# Patient Record
Sex: Male | Born: 1980 | Race: White | Hispanic: No | Marital: Single | Smoking: Former smoker
Health system: Southern US, Community
[De-identification: ages and names within clinical notes are randomized; demographics above are authoritative.]

## PROBLEM LIST (undated history)

## (undated) DIAGNOSIS — F4310 Post-traumatic stress disorder, unspecified: Secondary | ICD-10-CM

## (undated) DIAGNOSIS — K760 Fatty (change of) liver, not elsewhere classified: Secondary | ICD-10-CM

## (undated) DIAGNOSIS — F1910 Other psychoactive substance abuse, uncomplicated: Secondary | ICD-10-CM

## (undated) DIAGNOSIS — F319 Bipolar disorder, unspecified: Secondary | ICD-10-CM

## (undated) DIAGNOSIS — F419 Anxiety disorder, unspecified: Secondary | ICD-10-CM

## (undated) HISTORY — PX: NO PAST SURGERIES: SHX2092

## (undated) HISTORY — PX: FRACTURE SURGERY: SHX138

---

## 2007-04-11 ENCOUNTER — Emergency Department: Payer: Self-pay | Admitting: Emergency Medicine

## 2008-03-26 ENCOUNTER — Emergency Department: Payer: Self-pay | Admitting: Emergency Medicine

## 2008-08-09 ENCOUNTER — Emergency Department (HOSPITAL_COMMUNITY): Admission: EM | Admit: 2008-08-09 | Discharge: 2008-08-09 | Payer: Self-pay | Admitting: Emergency Medicine

## 2008-08-31 ENCOUNTER — Emergency Department: Payer: Self-pay | Admitting: Emergency Medicine

## 2008-09-18 ENCOUNTER — Emergency Department: Payer: Self-pay | Admitting: Emergency Medicine

## 2009-01-24 ENCOUNTER — Emergency Department: Payer: Self-pay | Admitting: Emergency Medicine

## 2009-04-18 ENCOUNTER — Emergency Department: Payer: Self-pay | Admitting: Emergency Medicine

## 2009-04-24 ENCOUNTER — Emergency Department: Payer: Self-pay | Admitting: Emergency Medicine

## 2009-09-24 ENCOUNTER — Inpatient Hospital Stay: Payer: Self-pay | Admitting: Psychiatry

## 2009-10-08 ENCOUNTER — Emergency Department: Payer: Self-pay | Admitting: Emergency Medicine

## 2009-10-26 ENCOUNTER — Emergency Department: Payer: Self-pay | Admitting: Emergency Medicine

## 2010-06-25 ENCOUNTER — Emergency Department: Payer: Self-pay | Admitting: Emergency Medicine

## 2010-07-15 LAB — POCT I-STAT, CHEM 8
BUN: 7 mg/dL (ref 6–23)
Calcium, Ion: 1.09 mmol/L — ABNORMAL LOW (ref 1.12–1.32)
Chloride: 105 mEq/L (ref 96–112)

## 2010-07-15 LAB — RAPID URINE DRUG SCREEN, HOSP PERFORMED
Barbiturates: NOT DETECTED
Opiates: NOT DETECTED
Tetrahydrocannabinol: NOT DETECTED

## 2010-07-15 LAB — ETHANOL: Alcohol, Ethyl (B): 229 mg/dL — ABNORMAL HIGH (ref 0–10)

## 2011-08-07 ENCOUNTER — Emergency Department: Payer: Self-pay | Admitting: Emergency Medicine

## 2011-08-08 ENCOUNTER — Emergency Department: Payer: Self-pay | Admitting: Unknown Physician Specialty

## 2011-08-13 ENCOUNTER — Emergency Department: Payer: Self-pay | Admitting: Emergency Medicine

## 2011-08-14 ENCOUNTER — Emergency Department (HOSPITAL_COMMUNITY)
Admission: EM | Admit: 2011-08-14 | Discharge: 2011-08-14 | Disposition: A | Discharge status: Home / Self Care | Payer: Self-pay | Attending: Emergency Medicine | Admitting: Emergency Medicine

## 2011-08-14 ENCOUNTER — Encounter (HOSPITAL_COMMUNITY): Payer: Self-pay

## 2011-08-14 DIAGNOSIS — Z09 Encounter for follow-up examination after completed treatment for conditions other than malignant neoplasm: Secondary | ICD-10-CM | POA: Insufficient documentation

## 2011-08-14 DIAGNOSIS — F319 Bipolar disorder, unspecified: Secondary | ICD-10-CM | POA: Insufficient documentation

## 2011-08-14 DIAGNOSIS — M25469 Effusion, unspecified knee: Secondary | ICD-10-CM | POA: Insufficient documentation

## 2011-08-14 DIAGNOSIS — IMO0002 Reserved for concepts with insufficient information to code with codable children: Secondary | ICD-10-CM

## 2011-08-14 DIAGNOSIS — M25569 Pain in unspecified knee: Secondary | ICD-10-CM | POA: Insufficient documentation

## 2011-08-14 HISTORY — DX: Bipolar disorder, unspecified: F31.9

## 2011-08-14 MED ORDER — OXYCODONE-ACETAMINOPHEN 5-325 MG PO TABS
1.0000 | ORAL_TABLET | Freq: Once | ORAL | Status: AC
  Administered 2011-08-14: 1 via ORAL
  Filled 2011-08-14: qty 1

## 2011-08-14 MED ORDER — OXYCODONE-ACETAMINOPHEN 5-325 MG PO TABS: 2.0000 | ORAL_TABLET | ORAL | Status: AC | PRN

## 2011-08-14 MED ORDER — IBUPROFEN 800 MG PO TABS
800.0000 mg | ORAL_TABLET | Freq: Once | ORAL | Status: AC
  Administered 2011-08-14: 800 mg via ORAL
  Filled 2011-08-14: qty 1

## 2011-08-14 NOTE — ED Notes (Signed)
Pt states he cut his right knee approx 3 days ago, had stitched up at [facility], states tonight he squeezed pus and blood from wound and is concerned that it is still infected.

## 2011-08-14 NOTE — Discharge Instructions (Signed)
Laceration Care, Adult °A laceration is a cut that goes through all layers of the skin. The cut goes into the tissue beneath the skin. °HOME CARE °For stitches (sutures) or staples: °· Keep the cut clean and dry.  °· If you have a bandage (dressing), change it at least once a day. Change the bandage if it gets wet or dirty, or as told by your doctor.  °· Wash the cut with soap and water 2 times a day. Rinse the cut with water. Pat it dry with a clean towel.  °· Put a thin layer of medicated cream on the cut as told by your doctor.  °· You may shower after the first 24 hours. Do not soak the cut in water until the stitches are removed.  °· Only take medicines as told by your doctor.  °· Have your stitches or staples removed as told by your doctor.  °For skin adhesive strips: °· Keep the cut clean and dry.  °· Do not get the strips wet. You may take a bath, but be careful to keep the cut dry.  °· If the cut gets wet, pat it dry with a clean towel.  °· The strips will fall off on their own. Do not remove the strips that are still stuck to the cut.  °For wound glue: °· You may shower or take baths. Do not soak or scrub the cut. Do not swim. Avoid heavy sweating until the glue falls off on its own. After a shower or bath, pat the cut dry with a clean towel.  °· Do not put medicine on your cut until the glue falls off.  °· If you have a bandage, do not put tape over the glue.  °· Avoid lots of sunlight or tanning lamps until the glue falls off. Put sunscreen on the cut for the first year to reduce your scar.  °· The glue will fall off on its own. Do not pick at the glue.  °You may need a tetanus shot if: °· You cannot remember when you had your last tetanus shot.  °· You have never had a tetanus shot.  °If you need a tetanus shot and you choose not to have one, you may get tetanus. Sickness from tetanus can be serious. °GET HELP RIGHT AWAY IF:  °· Your pain does not get better with medicine.  °· Your arm, hand, leg, or  foot loses feeling (numbness) or changes color.  °· Your cut is bleeding.  °· Your joint feels weak, or you cannot use your joint.  °· You have painful lumps on your body.  °· Your cut is red, puffy (swollen), or painful.  °· You have a red line on the skin near the cut.  °· You have yellowish-white fluid (pus) coming from the cut.  °· You have a fever.  °· You have a bad smell coming from the cut or bandage.  °· Your cut breaks open before or after stitches are removed.  °· You notice something coming out of the cut, such as wood or glass.  °· You cannot move a finger or toe.  °MAKE SURE YOU:  °· Understand these instructions.  °· Will watch your condition.  °· Will get help right away if you are not doing well or get worse.  °Document Released: 09/09/2007 Document Revised: 03/12/2011 Document Reviewed: 09/16/2010 °[employer]® Patient Information ©2012 [employer]. °

## 2011-08-14 NOTE — ED Provider Notes (Signed)
History     CSN: [id number]  Arrival date & time 08/14/11  0865   First MD Initiated Contact with Patient 08/14/11 0113      Chief Complaint  Patient presents with  . Wound Check    (Consider location/radiation/quality/duration/timing/severity/associated sxs/prior treatment) HPI Jerry Herrera is a 31 y.o. male who presents to the Emergency Department complaining of possible infection to a knee wound. 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 He was seen at [facility] and the laceration was repaired with sutures. He reports that earlier today he saw both pus and blood come from the suture line. He has had continued pain to the site and swelling to the lower extremity. He denies fever, chills, nausea, vomiting. He is on 2 antibiotics, both of which he is taking. He is out of his pain medicine.  Past Medical History  Diagnosis Date  . Bipolar 1 disorder     History reviewed. No pertinent past surgical history.  No family history on file.  History  Substance Use Topics  . Smoking status: Current Everyday Smoker  . Smokeless tobacco: Not on file  . Alcohol Use: No      Review of Systems  Constitutional: Negative for fever.       10 Systems reviewed and are negative for acute change except as noted in the HPI.  HENT: Negative for congestion.   Eyes: Negative for discharge and redness.  Respiratory: Negative for cough and shortness of breath.   Cardiovascular: Negative for chest pain.  Gastrointestinal: Negative for vomiting and abdominal pain.  Musculoskeletal: Negative for back pain.  Skin: Negative for rash.       Laceration to right leg  Neurological: Negative for syncope, numbness and headaches.  Psychiatric/Behavioral:       No behavior change.    Allergies  Review of patient's allergies indicates no known allergies.  Home Medications   Current Outpatient Rx  Name Route Sig Dispense Refill  . ALPRAZOLAM 1 MG PO  TABS Oral Take 1 mg by mouth 3 (three) times daily as needed.    . CEPHALEXIN 500 MG PO CAPS Oral Take 500 mg by mouth 4 (four) times daily.    Marland Kitchen CIPROFLOXACIN HCL 500 MG PO TABS Oral Take 500 mg by mouth 2 (two) times daily.    Marland Kitchen OLANZAPINE 5 MG PO TABS Oral Take 5 mg by mouth 2 times daily at 12 noon and 4 pm.    . OXYCODONE-ACETAMINOPHEN 7.5-325 MG PO TABS Oral Take 1 tablet by mouth every 4 (four) hours as needed.      There were no vitals taken for this visit.  Physical Exam  Nursing note and vitals reviewed. Constitutional:       Awake, alert, nontoxic appearance.  HENT:  Head: Atraumatic.  Eyes: Right eye exhibits no discharge. Left eye exhibits no discharge.  Neck: Neck supple.  Pulmonary/Chest: Effort normal. He exhibits no tenderness.  Abdominal: Soft. There is no tenderness. There is no rebound.  Musculoskeletal: He exhibits no tenderness.       Baseline ROM, no obvious new focal weakness. 1+ edema to right ankle and foot. DP and PT pulses present.  Neurological:       Mental status and motor strength appears baseline for patient and situation.  Skin: No rash noted.       Healing sutured laceration  7 cm  to right upper leg above the knee. No evidence of infection. Continued  bruising to the thigh which is resolving. Bruising is due to the tourniquet he on his leg at the time of the injury. There is some mild discomfort to palpation around the laceration. There is no erythema, exudate, oozing.  Psychiatric: He has a normal mood and affect.    ED Course  Procedures (including critical care time)    MDM  Patient with some healing laceration to his right leg here for a wound recheck. No evidence of infection. Sutures are intact, edges are well approximated.given an analgesic.Pt stable in ED with no significant deterioration in condition.The patient appears reasonably screened and/or stabilized for discharge and I doubt any other medical condition or other [facility] requiring  further screening, evaluation, or treatment in the ED at this time prior to discharge.  MDM Reviewed: nursing note and vitals           Nicoletta Dress. Colon Branch, MD 08/14/11 [phone]

## 2011-09-15 ENCOUNTER — Emergency Department: Payer: Self-pay | Admitting: Emergency Medicine

## 2011-09-15 LAB — ACETAMINOPHEN LEVEL: Acetaminophen: <2 ug/mL

## 2011-09-15 LAB — COMPREHENSIVE METABOLIC PANEL
Albumin: 3.5 g/dL (ref 3.4–5.0)
Alkaline Phosphatase: 103 U/L (ref 50–136)
Anion Gap: 8 (ref 7–16)
BUN: 7 mg/dL (ref 7–18)
Bilirubin,Total: 0.4 mg/dL (ref 0.2–1.0)
Chloride: 105 mmol/L (ref 98–107)
Co2: 27 mmol/L (ref 21–32)
Creatinine: 0.76 mg/dL (ref 0.60–1.30)
Glucose: 91 mg/dL (ref 65–99)
Osmolality: 277 (ref 275–301)
Potassium: 3.1 mmol/L — ABNORMAL LOW (ref 3.5–5.1)
SGOT(AST): 21 U/L (ref 15–37)
SGPT (ALT): 17 U/L
Sodium: 140 mmol/L (ref 136–145)

## 2011-09-15 LAB — CBC
HGB: 13.6 g/dL (ref 13.0–18.0)
MCH: 31 pg (ref 26.0–34.0)
MCHC: 33.7 g/dL (ref 32.0–36.0)
MCV: 92 fL (ref 80–100)
Platelet: 305 10*3/uL (ref 150–440)
WBC: 8.9 10*3/uL (ref 3.8–10.6)

## 2011-09-15 LAB — DRUG SCREEN, URINE
MDMA (Ecstasy)Ur Screen: NEGATIVE (ref ?–500)
Opiate, Ur Screen: POSITIVE (ref ?–300)
Tricyclic, Ur Screen: NEGATIVE (ref ?–1000)

## 2011-09-15 LAB — TSH: Thyroid Stimulating Horm: 2.25 u[IU]/mL

## 2011-11-05 ENCOUNTER — Inpatient Hospital Stay: Payer: Self-pay | Admitting: Psychiatry

## 2011-11-05 LAB — DRUG SCREEN, URINE
Amphetamines, Ur Screen: NEGATIVE (ref ?–1000)
Barbiturates, Ur Screen: NEGATIVE (ref ?–200)
Benzodiazepine, Ur Scrn: NEGATIVE (ref ?–200)
Cannabinoid 50 Ng, Ur ~~LOC~~: POSITIVE (ref ?–50)
Methadone, Ur Screen: NEGATIVE (ref ?–300)
Phencyclidine (PCP) Ur S: NEGATIVE (ref ?–25)

## 2011-11-05 LAB — CBC
HGB: 15.3 g/dL (ref 13.0–18.0)
MCV: 90 fL (ref 80–100)
Platelet: 306 10*3/uL (ref 150–440)
RBC: 4.9 10*6/uL (ref 4.40–5.90)
RDW: 13.9 % (ref 11.5–14.5)
WBC: 6.6 10*3/uL (ref 3.8–10.6)

## 2011-11-05 LAB — COMPREHENSIVE METABOLIC PANEL
Albumin: 4.1 g/dL (ref 3.4–5.0)
Calcium, Total: 9 mg/dL (ref 8.5–10.1)
EGFR (African American): >60
EGFR (Non-African Amer.): >60
Glucose: 87 mg/dL (ref 65–99)
Potassium: 4.3 mmol/L (ref 3.5–5.1)
Sodium: 142 mmol/L (ref 136–145)

## 2011-11-05 LAB — ETHANOL: Ethanol %: <0.003 % (ref 0.000–0.080)

## 2011-11-05 LAB — ACETAMINOPHEN LEVEL: Acetaminophen: <2 ug/mL

## 2011-11-13 ENCOUNTER — Inpatient Hospital Stay: Payer: Self-pay | Admitting: Internal Medicine

## 2011-11-13 LAB — DRUG SCREEN, URINE
Barbiturates, Ur Screen: NEGATIVE (ref ?–200)
Benzodiazepine, Ur Scrn: POSITIVE (ref ?–200)
Cannabinoid 50 Ng, Ur ~~LOC~~: POSITIVE (ref ?–50)
MDMA (Ecstasy)Ur Screen: NEGATIVE (ref ?–500)
Methadone, Ur Screen: NEGATIVE (ref ?–300)
Phencyclidine (PCP) Ur S: NEGATIVE (ref ?–25)

## 2011-11-13 LAB — URINALYSIS, COMPLETE
Bacteria: NONE SEEN
Bilirubin,UR: NEGATIVE
Glucose,UR: NEGATIVE mg/dL (ref 0–75)
Granular Cast: 11
Leukocyte Esterase: NEGATIVE
Nitrite: NEGATIVE
RBC,UR: <1 /HPF (ref 0–5)

## 2011-11-13 LAB — CBC
HCT: 44.5 % (ref 40.0–52.0)
HGB: 14.7 g/dL (ref 13.0–18.0)
MCHC: 33 g/dL (ref 32.0–36.0)
RBC: 4.9 10*6/uL (ref 4.40–5.90)
WBC: 11 10*3/uL — ABNORMAL HIGH (ref 3.8–10.6)

## 2011-11-13 LAB — COMPREHENSIVE METABOLIC PANEL
Albumin: 3.9 g/dL (ref 3.4–5.0)
Alkaline Phosphatase: 102 U/L (ref 50–136)
Bilirubin,Total: 0.9 mg/dL (ref 0.2–1.0)
Creatinine: 0.97 mg/dL (ref 0.60–1.30)
EGFR (African American): >60
Glucose: 99 mg/dL (ref 65–99)
Osmolality: 288 (ref 275–301)
Sodium: 145 mmol/L (ref 136–145)

## 2011-11-13 LAB — SALICYLATE LEVEL: Salicylates, Serum: 2.6 mg/dL

## 2011-11-13 LAB — ETHANOL: Ethanol %: 0.175 % — ABNORMAL HIGH (ref 0.000–0.080)

## 2012-03-26 ENCOUNTER — Emergency Department: Payer: Self-pay | Admitting: Emergency Medicine

## 2013-04-29 ENCOUNTER — Emergency Department: Payer: Self-pay | Admitting: Emergency Medicine

## 2013-04-29 LAB — COMPREHENSIVE METABOLIC PANEL
ALK PHOS: 110 U/L
ANION GAP: 4 — AB (ref 7–16)
AST: 27 U/L (ref 15–37)
Albumin: 4.2 g/dL (ref 3.4–5.0)
BILIRUBIN TOTAL: 0.7 mg/dL (ref 0.2–1.0)
BUN: 13 mg/dL (ref 7–18)
CO2: 28 mmol/L (ref 21–32)
Calcium, Total: 9.1 mg/dL (ref 8.5–10.1)
Chloride: 105 mmol/L (ref 98–107)
Creatinine: 0.91 mg/dL (ref 0.60–1.30)
EGFR (Non-African Amer.): >60
Glucose: 110 mg/dL — ABNORMAL HIGH (ref 65–99)
Osmolality: 275 (ref 275–301)
Potassium: 3.5 mmol/L (ref 3.5–5.1)
SGPT (ALT): 24 U/L (ref 12–78)
SODIUM: 137 mmol/L (ref 136–145)
Total Protein: 7.5 g/dL (ref 6.4–8.2)

## 2013-04-29 LAB — ETHANOL
Ethanol %: <0.003 % (ref 0.000–0.080)
Ethanol: <3 mg/dL

## 2013-04-29 LAB — CBC
HCT: 46 % (ref 40.0–52.0)
HGB: 15.7 g/dL (ref 13.0–18.0)
MCH: 31.2 pg (ref 26.0–34.0)
MCHC: 34.1 g/dL (ref 32.0–36.0)
MCV: 92 fL (ref 80–100)
PLATELETS: 266 10*3/uL (ref 150–440)
RBC: 5.02 10*6/uL (ref 4.40–5.90)
RDW: 12.5 % (ref 11.5–14.5)
WBC: 8.7 10*3/uL (ref 3.8–10.6)

## 2013-04-29 LAB — ACETAMINOPHEN LEVEL: Acetaminophen: <2 ug/mL

## 2013-04-29 LAB — SALICYLATE LEVEL: Salicylates, Serum: <1.7 mg/dL

## 2013-04-30 LAB — URINALYSIS, COMPLETE
BILIRUBIN, UR: NEGATIVE
Blood: NEGATIVE
Glucose,UR: NEGATIVE mg/dL (ref 0–75)
Ketone: NEGATIVE
LEUKOCYTE ESTERASE: NEGATIVE
Nitrite: NEGATIVE
Ph: 6 (ref 4.5–8.0)
Protein: NEGATIVE
RBC,UR: NONE SEEN /HPF (ref 0–5)
SPECIFIC GRAVITY: 1.002 (ref 1.003–1.030)
Squamous Epithelial: <1
WBC UR: NONE SEEN /HPF (ref 0–5)

## 2013-04-30 LAB — DRUG SCREEN, URINE
Amphetamines, Ur Screen: NEGATIVE (ref ?–1000)
Barbiturates, Ur Screen: NEGATIVE (ref ?–200)
Benzodiazepine, Ur Scrn: NEGATIVE (ref ?–200)
Cannabinoid 50 Ng, Ur ~~LOC~~: NEGATIVE (ref ?–50)
Cocaine Metabolite,Ur ~~LOC~~: NEGATIVE (ref ?–300)
MDMA (ECSTASY) UR SCREEN: NEGATIVE (ref ?–500)
Methadone, Ur Screen: NEGATIVE (ref ?–300)
Opiate, Ur Screen: NEGATIVE (ref ?–300)
PHENCYCLIDINE (PCP) UR S: NEGATIVE (ref ?–25)
Tricyclic, Ur Screen: NEGATIVE (ref ?–1000)

## 2013-05-24 ENCOUNTER — Emergency Department: Payer: Self-pay | Admitting: Emergency Medicine

## 2013-07-22 ENCOUNTER — Emergency Department: Payer: Self-pay | Admitting: Emergency Medicine

## 2013-07-22 LAB — CBC
HCT: 42.8 % (ref 40.0–52.0)
HGB: 14.4 g/dL (ref 13.0–18.0)
MCH: 30.4 pg (ref 26.0–34.0)
MCHC: 33.6 g/dL (ref 32.0–36.0)
MCV: 91 fL (ref 80–100)
Platelet: 273 10*3/uL (ref 150–440)
RBC: 4.73 10*6/uL (ref 4.40–5.90)
RDW: 12.9 % (ref 11.5–14.5)
WBC: 8.1 10*3/uL (ref 3.8–10.6)

## 2013-07-22 LAB — BASIC METABOLIC PANEL
Anion Gap: 9 (ref 7–16)
BUN: 11 mg/dL (ref 7–18)
CREATININE: 0.89 mg/dL (ref 0.60–1.30)
Calcium, Total: 9.1 mg/dL (ref 8.5–10.1)
Chloride: 108 mmol/L — ABNORMAL HIGH (ref 98–107)
Co2: 25 mmol/L (ref 21–32)
EGFR (Non-African Amer.): >60
GLUCOSE: 88 mg/dL (ref 65–99)
Osmolality: 282 (ref 275–301)
Potassium: 3.5 mmol/L (ref 3.5–5.1)
Sodium: 142 mmol/L (ref 136–145)

## 2014-01-14 ENCOUNTER — Emergency Department: Payer: Self-pay | Admitting: Emergency Medicine

## 2014-01-14 LAB — CBC
HCT: 41.9 % (ref 40.0–52.0)
HGB: 14.1 g/dL (ref 13.0–18.0)
MCH: 31.3 pg (ref 26.0–34.0)
MCHC: 33.7 g/dL (ref 32.0–36.0)
MCV: 93 fL (ref 80–100)
Platelet: 266 10*3/uL (ref 150–440)
RBC: 4.51 10*6/uL (ref 4.40–5.90)
RDW: 12.5 % (ref 11.5–14.5)
WBC: 7.4 10*3/uL (ref 3.8–10.6)

## 2014-01-14 LAB — URINALYSIS, COMPLETE
BACTERIA: NONE SEEN
Bilirubin,UR: NEGATIVE
Blood: NEGATIVE
Glucose,UR: NEGATIVE mg/dL (ref 0–75)
Ketone: NEGATIVE
LEUKOCYTE ESTERASE: NEGATIVE
Nitrite: NEGATIVE
Ph: 6 (ref 4.5–8.0)
Protein: NEGATIVE
RBC, UR: NONE SEEN /HPF (ref 0–5)
Specific Gravity: 1 (ref 1.003–1.030)
Squamous Epithelial: NONE SEEN
WBC UR: NONE SEEN /HPF (ref 0–5)

## 2014-01-14 LAB — COMPREHENSIVE METABOLIC PANEL
ALBUMIN: 3.9 g/dL (ref 3.4–5.0)
ALK PHOS: 78 U/L
ALT: 28 U/L
Anion Gap: 5 — ABNORMAL LOW (ref 7–16)
BUN: 13 mg/dL (ref 7–18)
Bilirubin,Total: 0.6 mg/dL (ref 0.2–1.0)
CREATININE: 1.03 mg/dL (ref 0.60–1.30)
Calcium, Total: 8.6 mg/dL (ref 8.5–10.1)
Chloride: 106 mmol/L (ref 98–107)
Co2: 32 mmol/L (ref 21–32)
EGFR (African American): >60
Glucose: 99 mg/dL (ref 65–99)
Osmolality: 285 (ref 275–301)
POTASSIUM: 4 mmol/L (ref 3.5–5.1)
SGOT(AST): 24 U/L (ref 15–37)
Sodium: 143 mmol/L (ref 136–145)
Total Protein: 7 g/dL (ref 6.4–8.2)

## 2014-01-14 LAB — ETHANOL

## 2014-01-14 LAB — DRUG SCREEN, URINE
Amphetamines, Ur Screen: NEGATIVE (ref ?–1000)
BENZODIAZEPINE, UR SCRN: NEGATIVE (ref ?–200)
Barbiturates, Ur Screen: NEGATIVE (ref ?–200)
Cannabinoid 50 Ng, Ur ~~LOC~~: POSITIVE (ref ?–50)
Cocaine Metabolite,Ur ~~LOC~~: NEGATIVE (ref ?–300)
MDMA (ECSTASY) UR SCREEN: NEGATIVE (ref ?–500)
Methadone, Ur Screen: NEGATIVE (ref ?–300)
OPIATE, UR SCREEN: NEGATIVE (ref ?–300)
Phencyclidine (PCP) Ur S: NEGATIVE (ref ?–25)
TRICYCLIC, UR SCREEN: NEGATIVE (ref ?–1000)

## 2014-01-14 LAB — SALICYLATE LEVEL: SALICYLATES, SERUM: 1.7 mg/dL

## 2014-01-14 LAB — ACETAMINOPHEN LEVEL: Acetaminophen: <2 ug/mL

## 2014-03-03 ENCOUNTER — Emergency Department: Payer: Self-pay | Admitting: Emergency Medicine

## 2014-07-24 NOTE — Consult Note (Signed)
Brief Consult Note: Diagnosis: bipolar disorder nos.   Patient was seen by consultant.   Recommend further assessment or treatment.   Orders entered.   Discussed with Attending MD.   Comments: Psychiatry: Patient seen. Hx of bbiopolar do and SA seeking admission and claims HI. [facility] See H&P.  Electronic Signatures: Audery Amellapacs, Olof Marcil T (MD)  (Signed 01-Aug-13 12:09)  Authored: Brief Consult Note   Last Updated: 01-Aug-13 12:09 by Audery Amellapacs, Cyrus Ramsburg T (MD)

## 2014-07-24 NOTE — Discharge Summary (Signed)
PATIENT NAME:  Jerry Herrera, [name] MR#:  [id number] DATE OF BIRTH:  [birth year]  DATE OF ADMISSION:  11/05/2011 DATE OF DISCHARGE:  11/06/2011  HOSPITAL COURSE: See dictated history and physical for details of admission. This 34 year old man presented the Emergency Room somewhat lethargic, a little bit disorganized, anxious, and at times made vague statements that he was thinking of hurting someone, although he was not specific about who he was intending to hurt. He was not aggressive or violent or threatening in the Emergency Room. He was cooperative with the initial admission process. The patient gave a history that he had been off of all psychiatric medicines for several months. He was seeking resumption of psychiatric treatment. He indicated that he had been treated for what he characterized as a diagnosis of bipolar disorder or posttraumatic stress disorder in the past, although he was primarily fixed on the idea that he needed to be resumed on benzodiazepines. The patient's old chart was reviewed as well as his current presentation including his drug screen being positive for cocaine and cannabis. It appears clear that the patient has a substance abuse disorder. Given that his drug screen was negative for benzodiazepines and that he was reporting having been off of medication recently, there was no indication for restarting benzodiazepines. This was explained to the patient based on his substance abuse diagnosis and the lack of specific indication. The patient became aggressive and agitated about this. He refused to cooperate with any other treatment. He indicated that he intended to get out of the hospital so that he could purchase drugs illegally on the street. He was not open to discussing alternative mental health treatments. At the same time he was not physically violent to anyone and did not threaten to harm himself and did not appear to be psychotic. His overall behavior is typical of a person with  a substance abuse disorder and antisocial personality. At this point, he is not acutely dangerous to himself or others and is not likely to benefit from further inpatient treatment but is likely to continue to be disruptive and noncompliant on the unit. The patient and treatment team agreed that it would be best for him to be discharged. He does have a place to live and was completely agreeable to being [facility].   LABORATORY DATA: Drug screen was positive for cocaine and cannabis. TSH normal. Alcohol nondetectable. Chemistry panel entirely normal. CBC entirely normal. Acetaminophen and salicylates negative.   DISCHARGE MEDICATIONS: None as the patient has not been compliant with discussion of any medicines other than benzodiazepines.   MENTAL STATUS EXAM AT DISCHARGE: Casually dressed, reasonably well groomed young man, looks his stated age. Cooperative with basic conversation. Eye contact decreased. Psychomotor activity normal. Speech normal rate, tone, and volume. Affect blunted. Mood stated as being fine. Thoughts logical and lucid with no evidence of thought disorder. Denies hallucinations. Denied suicidal or homicidal ideation. Chronically poor judgment and insight related to substance abuse, not likely to change in the hospital. Normal intelligence. Short and long-term memory in tact.   DISPOSITION: He is discharged back to his home where he had been living previously and will be [facility]up [facility] treatment.   DIAGNOSIS PRINCIPLE AND PRIMARY:   AXIS I: Polysubstance dependence.   SECONDARY DIAGNOSES:   AXIS I: Deferred.   AXIS II: Antisocial personality disorder.   AXIS III: No diagnosis.   AXIS IV: Limited financial resources, lack of a job, and unclear  status of his relationship.   AXIS V: Functioning at time of discharge 55.  ____________________________ Audery Amel, MD jtc:slb D: 11/06/2011 10:29:03  ET T: 11/06/2011 13:49:09 ET JOB#: [id number]  cc: Audery Amel, MD, <Dictator> Audery Amel MD ELECTRONICALLY SIGNED 11/06/2011 14:23
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
## 2014-07-24 NOTE — Consult Note (Signed)
PATIENT NAME:  Jerry Herrera, Jerry Herrera MR#:  [id number] DATE OF BIRTH:  1980/07/14  DATE OF CONSULTATION:  11/14/2011  REFERRING PHYSICIAN:   CONSULTING PHYSICIAN:  Kaytlynn Kochan K. 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 The patient is single, never married and lives with his mother who is 18 years old. The patient is admitted to the medical floor and according to information obtained, the patient said, "I got hurt. They are trying to hurt me and police brought me". According to the information obtained from the chart, the patient was brought to the Emergency Room on 11/13/2011 with lacerations from resisting police. He has multiple warrants as per the officer and he is to return to jail as soon as he is medically cleared. When the patient was asked the reason he was brought here, he reported, "I got hit. Can't you see?  I need help. Police did it".   HISTORY OF PRESENT ILLNESS:  The patient reports that he has a long history of mental illness and he was being followed at [facility] where he was last seen for adjustment of his medications. He reported that [facility] adjusted him and he was discharged on the following medications: Zyprexa 10 milligrams p.o. b.i.d. and Xanax 1 milligram p.o. t.i.d. The patient reports that he was inpatient in psychiatry about four times. History of suicide attempt many years ago when he was a child and no problems since then. Admits that he gets his medications from Dr. Marguerite Olea and has not seen him in quite some time.   ALCOHOL AND DRUGS: When asked about history of alcohol and drinking problems, he reported, "I don't drink alcohol on a regular basis. It does not help me. I tried to self medicate myself with Klonopin and Xanax that I get them". He reports that he likes to smoke [facility] on an every day basis if he can get it. Denies any crack  or cocaine abuse but does admit smoking nicotine cigarettes, about four or five per day.   LEGAL HISTORY: The patient reports that he has legal charges pending for a misdemeanor and drunk driving. He has to go to court.  MENTAL STATUS EXAMINATION: The patient was seen lying in bed. He was alert and oriented to place, person and time. Articulate and spontaneous in giving information. Admits feeling depressed. Admits feeling hopeless and helpless about his court date pending and coming up. Admits feeling worthless and useless because he is not able to provide and work like he wants to. Denies any active suicidal or homicidal ideas and plans and says not now. Denies any psychotic symptoms. Denies auditory or visual hallucinations. Denies hearing voices or seeing things. Denies any paranoid ideas except that he is upset about him being hit and hurt and having to come here. Cognition is intact. He could count money. Memory is intact. Contracts for safety and denies any active suicidal or homicidal ideas or plans and wants to get better. Insight and judgment guarded.   IMPRESSION: AXIS I:  1. THC dependence.  2. Nicotine dependence. 3. Alcohol abuse. 4. Depressive mood disorder, not otherwise specified.   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   AXIS III: Lacerations on his face and his body.   AXIS IV: Severe. Unemployed, occupational problems, financial problems, legal problems and has court date pending.   AXIS V: Global Assessment  of Functioning 30.  RECOMMENDATION: The patient can be continued on Xanax 1 milligram p.o. t.i.d. and Zyprexa 10 milligrams p.o. b.i.d. as [facility]. He has an appointment with Dr. Marguerite OleaMoffett from whom he has been getting his medications. Discharge the patient when he is medically cleared and stable. No need for the patient to come to Behavioral Health at this time as [facility] and he does need to be  continued on above medications. He is considered safe to go back and keep up his followup appointments in the community.    ____________________________ Jannet MantisSurya K. Guss Bundehalla, MD skc:ap D: 11/14/2011 20:24:14 ET T: 11/15/2011 12:00:14 ET JOB#: [id number]  cc: Monika SalkSurya K. Guss Bundehalla, MD, <Dictator> Beau FannySURYA K Konstantin Lehnen MD ELECTRONICALLY SIGNED 11/15/2011 18:50

## 2014-07-24 NOTE — Discharge Summary (Signed)
PATIENT NAME:  Jerry Herrera, Jerry Herrera MR#:  [id number] DATE OF BIRTH:  [birth year]  DATE OF ADMISSION:  11/13/2011 DATE OF DISCHARGE:  11/15/2011  DISCHARGE DIAGNOSES:  1. Altered mental status secondary to alcohol abuse and also medications.  2. History of nicotine dependence. 3. Alcohol dependence. 4. Polysubstance abuse. 5. Personality disorder with antisocial traits.   DISCHARGE MEDICATIONS:  1. Xanax 1 mg p.o. t.i.Herrera.  2. Zyprexa 10 mg p.o. b.i.Herrera.   CONSULTATION: Psychiatry consult with Dr. Guss Bundehalla    DISCHARGE DISPOSITION: Discharge with law enforcement officer and probably dispose to the jail.   HOSPITAL COURSE: The patient is a 34 year old male patient with history of alcohol abuse, tobacco abuse, and history of antisocial personality admitted on the 9th because of alcohol intoxication. The patient was brought in by police. The patient was pounding his head to the window and got a skin laceration and head laceration. Head laceration was sutured by the ER doctor. In the ER because of agitation he received Ativan, Haldol, and Benadryl and was admitted to medical service because of mental status change. The patient's CT of the head is normal. His urine toxicology is positive for benzos and also cannabinoids. The patient salicylates were 2.6. Acetaminophen was less than 2. The patient's ethanol level was 0.175. The patient was monitored on the floor, started on IV fluids, made n.p.o. and monitored overnight. The patient also was agitated. He had history of alcohol abuse and had elevated alcohol level on admission but he never required any CIWA protocol. He did not have any tremors or hallucinations. The patient was seen by the psychiatrist, Dr. Guss Bundehalla, who recommended no inpatient psychiatric assessment needed. The patient had multiple warrants as per the police officer and he had to return to jail when he was medically cleared. The patient got hurt over the right shoulder and also left hip. He  complained of pain. We did right shoulder x-ray and left hip x-ray which were negative for fractures. Dr. Guss Bundehalla recommended that he can be on Xanax 1 mg p.o. t.i.Herrera. and Zyprexa 10 mg p.o. b.i.Herrera. I wrote a prescription for that. The patient is going to go to jail with escort of police officer. The patient is feeling better. No homicidal or suicidal ideation.   TIME SPENT ON DISCHARGE PREPARATION: More than 30 minutes.   ____________________________ Katha HammingSnehalatha Jevaeh Shams, MD sk:drc Herrera: 11/15/2011 12:41:13 ET T: 11/16/2011 11:51:55 ET JOB#: [id number]  cc: Katha HammingSnehalatha Shanedra Lave, MD, <Dictator> Katha HammingSNEHALATHA Regenia Erck MD ELECTRONICALLY SIGNED 12/02/2011 14:37

## 2014-07-24 NOTE — H&P (Signed)
PATIENT NAME:  Jerry Herrera, Jerry Herrera MR#:  [id number] DATE OF BIRTH:  1981-01-30  DATE OF ADMISSION:  11/13/2011  PRIMARY CARE PHYSICIAN: None local.   REFERRING PHYSICIAN: Dr. Darnelle CatalanMalinda.   CHIEF COMPLAINT: Drug overdose, sedation.   HISTORY OF PRESENT ILLNESS: A 34 year old Caucasian male with a history of PTSD, bipolar disorder, anxiety, polysubstance abuse, alcohol abuse, presented to the ED, was brought by policeman to ED due to agitation, fighting with policemen. In addition, patient pounded his head to the window and got a skin laceration. Patient was given Ativan, Haldol in the ED and then became sedated, unresponsive, but no respiratory distress. Dr Darnelle CatalanMalinda asked Behavioral Medicine to not take patient due to patient's unresponsiveness and the sedation. Dr. Darnelle CatalanMalinda admitted patient to Medicine.   PAST MEDICAL HISTORY: As mentioned above, PTSD, bipolar disorder, anxiety, polysubstance abuse, alcohol abuse.   SOCIAL HISTORY: Unable to obtain at this time but has a history of polysubstance abuse, alcohol abuse.   FAMILY HISTORY: Unknown.   PAST SURGICAL HISTORY: Unknown.  REVIEW OF SYSTEMS: Unable to obtain at this time   ALLERGIES: No.     MEDICATIONS: Unknown at this time.    PHYSICAL EXAMINATION:  VITAL SIGNS: Blood pressure 100/47, pulse 65, respirations 18, O2 saturation 97% in room air.   GENERAL: The patient is sedated, unresponsive, in no acute distress.   HEENT: Pupils are round, equal, reactive to light. There is a laceration on the right side on forehead.  There are some abrasions on the face and neck with clotted blood.   NECK: Supple. No JVD or carotid bruits. No lymphadenopathy. No thyromegaly.   CARDIOVASCULAR: S1, S2, regular rate and rhythm. No murmurs, gallops.   PULMONARY: Bilateral air entry. No wheezing or rales.   ABDOMEN: Soft. No distention. Bowel sounds present. No organomegaly.   EXTREMITIES: No edema, clubbing, or cyanosis. There is some abrasion  on the lower extremities. Strong bilateral pedal pulses.   SKIN: No rash or jaundice.   NEUROLOGY: The patient is unresponsive, sedated, unable to exam at this time.   LABORATORY DATA: CAT scan of head showed no acute intracranial abnormality. Acetaminophen is less than 2, salicylate 2.6. Urine drug screen showed cannabinoid positive, benzodiazepine  positive. WBC 11, hemoglobin 14.7, platelets 286,000. Glucose 99, BUN 10, creatinine 0.97. Electrolytes are normal. Ethanol level 175. TSH 2.38. Urinalysis negative.    IMPRESSION:  1. Drug overdose possibly due to Ativan and Haldol.  2. Agitation, possibly due to polysubstance abuse and alcohol abuse.  3. Head laceration.  4. History of posttraumatic stress disorder, bipolar disorder, anxiety.   PLAN OF TREATMENT:  1. The patient will be admitted to medical floor. We will keep n.p.o., give IV fluid normal saline 100 mL per hour, and we will follow up with Behavior Medicine. Patient may be transferred to Behavioral Medicine if medically stable.  2. We will get CIWA protocol due to alcohol abuse.    3. Gastrointestinal, deep vein thrombosis prophylaxis.  4. Discussed with Dr. Darnelle CatalanMalinda, case manager, and policeman.       TIME SPENT: About 55 minutes.      ____________________________ Shaune PollackQing Leighana Neyman, MD qc:vtd Herrera: 11/13/2011 20:37:07 ET T: 11/14/2011 05:25:41 ET JOB#: [id number]  cc: Shaune PollackQing Natalio Salois, MD, <Dictator> Shaune PollackQING Bonny Vanleeuwen MD ELECTRONICALLY SIGNED 11/16/2011 2:12

## 2014-07-28 NOTE — Consult Note (Signed)
PATIENT NAME:  Jerry Herrera, Sargent D MR#:  [id number] DATE OF BIRTH:  08-Mar-1981  DATE OF CONSULTATION:  07/22/2013  REFERRING PHYSICIAN:   CONSULTING PHYSICIAN:  Ruthe MannanPeter Ahriana Gunkel, MD (Orthopaedic Surgery and Hand Surgery)  PHYSICIAN REQUESTING CONSULTATION: Dr. Margarita GrizzleWoodruff of the Emergency Department.  CHIEF COMPLAINT: Right hand laceration.  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 The window broke and he suffered a laceration to his right thenar region. He had bleeding that was uncontrollable in the emergency department. I was asked to emergently consult for uncontrolled bleeding.  On my interview with the patient, he did endorse thumb numbness. However, he had previously received lidocaine with epinephrine by the Emergency Department physicians.  PAST MEDICAL HISTORY: Anxiety.  MEDICATIONS: None.  ALLERGIES: None.  FAMILY HISTORY: Noncontributory.   SOCIAL HISTORY: The patient smokes. He is here with his girlfriend. He was recently released from prison.  REVIEW OF SYSTEMS: Other than the right hand laceration, the review of systems is negative.  PHYSICAL EXAMINATION: GENERAL: Vitals as stable. NEUROLOGIC: The patient is awake and alert x 3. He does not appear intoxicated. He is very anxious. HEENT: Head atraumatic. CHEST: Equal and symmetric. EXTREMITIES: Right upper extremity: The patient has a 5 cm bleeding, longitudional wound over the thenar eminence of the right hand. He is numb on the radial aspect of the right thumb. He has intact sensation throughout the remainder of the fingertips on the radial and ulnar sides. All the fingertips are warm and well perfused.  PROCEDURE: Irrigation, exploration, and closure of the right thenar wound was performed. The patient's limb was exsanguinated with an Esmarch bandage. A tourniquet was elevated. Right upper extremity was prepped and draped with iodine and sterile towels. Blunt scissors  were used to explore the wound. It was largely skin and subcutaneous tissue. There was some laceration of the thenar musculature but there did not appear to be any extension going deep. I was unable to visualize the digital nerves of the thumb. The traumatic wound appears radial enough to avoid the median nerve and the recurrent branch of the median nerve but not so radial as to involve the deep branch of the radial artery. I dont think it goes distal or deep enough to get the digital nerves of the thumb, but I was unable to directly visualize the digital nerves so I cannot say that they are or are not intact. The wound was irrigated, cleansed with saline, and closed with locked horizontal mattress sutures of 4-0 nylon.  ASSESSMENT: A 34 year old male with a right hand laceration, likely simple. Possible radial digital nerve tol the thumb injury.  DISCUSSION: I had a long discussion with the patient. I explained that if he has persistent numbness on the radial aspect of his thumb, that this will need a formal exploration ijn the OR with a radial digital nerve repair. It is not certain to me whether or not this is residual numbness from his previous local anesthetic or if it is due to a traumatic laceration. I would recommended that the patient follow up with a qualified hand surgeon in the next 3 to 5 days for repeat examination and then follow up again in 2 weeks for suture removal.  The patient did communicate to me that due to his financial situation, that if he does not perceive any numbness in his thumb, he will likely not follow up in 3 to 5 days and that he may go ahead and  just take his own stitches out in 2 weeks.    ____________________________ Ruthe Mannan, MD pa:lt D: 07/22/2013 21:04:43 ET T: 07/23/2013 02:42:46 ET JOB#: [id number]  cc: Ruthe Mannan, MD, <Dictator> Althea Grimmer. Mickle Plumb, MD PhD Orthopaedic Surgery ELECTRONICALLY SIGNED 07/23/2013 12:49

## 2014-07-28 NOTE — Consult Note (Signed)
PATIENT NAME:  Jerry Herrera, Jerry Herrera MR#:  [id number] DATE OF BIRTH:  [birth year]  DATE OF CONSULTATION:  01/15/2014  REQUESTING PHYSICIAN:  Daryel November, MD    CONSULTING PHYSICIAN:  Ardeen Fillers. Garnetta Buddy, MD   REASON FOR CONSULTATION: "I'm depressed and suicidal."   HISTORY OF PRESENT ILLNESS:  The patient is a 34 year old single white male who presented to the ED accompanied by a friend.  He reported initially that he is post release from the prison and has been having thoughts to hurt himself.  During my interview, the patient reported that he came in last night as he was feeling unstable.  He reported that he has history of PTSD and has other diagnoses.  He reported that he does not do well with people.  He wants to become a productive member society.  He reported that he was released on 04/13/2012.  He has just completed "post release."  He currently lives with his girlfriend.  He is trying to find  gainful employment. The patient reported that there was some lack of concern for his safety and he was having some suicidal as well as homicidal ideations.  However, he was not having any homicidal ideations for anybody in particular.  He reported that he is not having any suicidal or homicidal ideations at this time.  He is trying to cope. The patient reported that he went to see Dr. Marguerite Olea on Friday and was given a prescription for Xanax and Zyprexa 2.5 mg on a p.r.n. basis.  He reported that he does not want to take a high dose of the medication due to sexual adverse effects.  He reported that he takes Xanax on a regular basis as it helps with his anxiety.  He is not interested in having his medications adjusted.  The patient reported that he used to work with other practitioners in the community and was telling me some other names. He reported that he does not want to take any of the psychotropic medications at this time.  He reported that he is feeling better now and wants to be discharged.  He  currently denied having any perceptual disturbances.  He currently denied having any suicidal thoughts.   PAST PSYCHIATRIC HISTORY: The patient has been diagnosed with PTSD, bipolar disorder, anxiety, and polysubstance dependence in the past. He has history of agitation as well as suicide attempt in the past.  He reported that he has cut his arm a long way in the past.  He was incarcerated due to malicious conduct by a prisoner and was there for approximately 2-1/2 years. He reported that he is not on probation at this time.    SUBSTANCE ABUSE HISTORY:  The patient has history of using marijuana on a consistent basis.  He reported that he was trying to drink too much water so it can leave his system.  He is also prescribed Xanax by Dr. Marguerite Olea.  However, it did not show up in his UDS.  He denied using drugs, including alcohol at this time.   FAMILY HISTORY: He reported that entire family has history of mood disorder as well as  substance use.   PAST SURGICAL HISTORY:   Unknown.    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 He currently lives with his girlfriend who has been staying with him  He reported that he has a 35-year-old daughter.  He denied any pending legal charges at this time.   REVIEW OF  SYSTEMS:  CONSTITUTIONAL: No fever or chills. No weight changes.  EYES: No double or blurred vision.  RESPIRATORY: No shortness of breath or cough.  CARDIOVASCULAR: No chest pain or orthopnea.  GASTROINTESTINAL: No abdominal pain, nausea, vomiting or diarrhea.  GENITOURINARY: No incontinence or frequency.  ENDOCRINE: No heat or cold intolerance.  LYMPHATIC: No anemia or easy bruising.  INTEGUMENTARY: No acne or rash.  MUSCULOSKELETAL: No muscle or joint pain.  NEUROLOGIC: No tingling or weakness.   VITAL SIGNS: Temperature 96.6, pulse 54, respirations 18, blood pressure 129/58.  LABORATORY DATA:  Glucose 99, BUN 13, creatinine 1.03, sodium 143, potassium  4.0, chloride 106, bicarbonate 32, anion gap 5, osmolality 285, calcium 8.6, protein 7.0, albumin 3.9, bilirubin 0.6, alkaline phosphatase 78, AST 24, ALT 28. UDS positive for cannabinoids.  WBC 7.4, RBC 4.5, hemoglobin 14.1, hematocrit 41.9, platelet 266,000.  MCV is 93, RDW is 12.5.   MENTAL STATUS EXAMINATION: The patient is a moderately built male who appeared his stated age.   He maintained fair eye contact.  His muscle tone appears normal with no abnormal movements noted.  Gait and station within normal limits. Speech was somewhat rapid with no paucity noted.  Thought process was logical, loose associations are somewhat present.  Thought content was non-delusional.  He currently denied having any suicidal or homicidal ideations or plans.  His insight and judgment were fair.  He was awake, alert, and oriented x 3. Mood was fine.  Affect was congruent. Attention span and concentration were normal.  Language and fund of knowledge seems appropriate.   DIAGNOSTIC IMPRESSION:  AXIS I: Bipolar disorder, not otherwise specified.  History of posttraumatic stress disorder.  History of polysubstance dependence.  AXIS II: None noted.  AXIS III: None noted.   TREATMENT PLAN:  The patient currently denied having any suicidal or homicidal ideations or plans.  He wants to be discharged as he reported that he is feeling safe and will follow up with Dr. Marguerite OleaMoffett.  He has an appointment tomorrow at 12:30.  He has enough supply of his Xanax and Zyprexa and is not interested in taking any other psychotropic medication due to sexual side effects.  I offered him several medications, but he currently declined.  The patient will be released safely from the ER at this time.  I advised him to come back if he noticed worsening of his symptoms, and he demonstrated understanding.   Thank you for allowing me to participate in the care of this patient.     ____________________________ Ardeen FillersUzma S. Garnetta BuddyFaheem, MD usf:DT D: 01/15/2014  11:45:21 ET T: 01/15/2014 12:25:52 ET JOB#: [id number]  cc: Ardeen FillersUzma S. Garnetta BuddyFaheem, MD, <Dictator> Rhunette CroftUZMA S Niranjan Rufener MD ELECTRONICALLY SIGNED 01/15/2014 14:09

## 2014-10-16 ENCOUNTER — Emergency Department: Payer: Self-pay

## 2014-10-16 ENCOUNTER — Encounter: Payer: Self-pay | Admitting: *Deleted

## 2014-10-16 ENCOUNTER — Emergency Department
Admission: EM | Admit: 2014-10-16 | Discharge: 2014-10-16 | Disposition: A | Discharge status: Home / Self Care | Payer: Self-pay | Attending: Emergency Medicine | Admitting: Emergency Medicine

## 2014-10-16 ENCOUNTER — Encounter: Payer: Self-pay | Admitting: Emergency Medicine

## 2014-10-16 DIAGNOSIS — Z720 Tobacco use: Secondary | ICD-10-CM | POA: Insufficient documentation

## 2014-10-16 DIAGNOSIS — Y9289 Other specified places as the place of occurrence of the external cause: Secondary | ICD-10-CM | POA: Insufficient documentation

## 2014-10-16 DIAGNOSIS — Z760 Encounter for issue of repeat prescription: Secondary | ICD-10-CM | POA: Insufficient documentation

## 2014-10-16 DIAGNOSIS — S62336A Displaced fracture of neck of fifth metacarpal bone, right hand, initial encounter for closed fracture: Secondary | ICD-10-CM | POA: Insufficient documentation

## 2014-10-16 DIAGNOSIS — X58XXXA Exposure to other specified factors, initial encounter: Secondary | ICD-10-CM | POA: Insufficient documentation

## 2014-10-16 DIAGNOSIS — Z792 Long term (current) use of antibiotics: Secondary | ICD-10-CM | POA: Insufficient documentation

## 2014-10-16 DIAGNOSIS — Y990 Civilian activity done for income or pay: Secondary | ICD-10-CM | POA: Insufficient documentation

## 2014-10-16 DIAGNOSIS — S62339A Displaced fracture of neck of unspecified metacarpal bone, initial encounter for closed fracture: Secondary | ICD-10-CM

## 2014-10-16 DIAGNOSIS — Y9389 Activity, other specified: Secondary | ICD-10-CM | POA: Insufficient documentation

## 2014-10-16 DIAGNOSIS — Z79899 Other long term (current) drug therapy: Secondary | ICD-10-CM | POA: Insufficient documentation

## 2014-10-16 DIAGNOSIS — M79641 Pain in right hand: Secondary | ICD-10-CM | POA: Insufficient documentation

## 2014-10-16 HISTORY — DX: Anxiety disorder, unspecified: F41.9

## 2014-10-16 HISTORY — DX: Other psychoactive substance abuse, uncomplicated: F19.10

## 2014-10-16 HISTORY — DX: Post-traumatic stress disorder, unspecified: F43.10

## 2014-10-16 MED ORDER — ALPRAZOLAM 0.5 MG PO TABS
1.0000 mg | ORAL_TABLET | Freq: Once | ORAL | Status: AC
  Administered 2014-10-16: 1 mg via ORAL

## 2014-10-16 MED ORDER — KETOROLAC TROMETHAMINE 60 MG/2ML IM SOLN
60.0000 mg | Freq: Once | INTRAMUSCULAR | Status: AC
  Administered 2014-10-16: 60 mg via INTRAMUSCULAR
  Filled 2014-10-16: qty 2

## 2014-10-16 MED ORDER — LORAZEPAM 2 MG/ML IJ SOLN: 1.0000 mg | Freq: Once | INTRAMUSCULAR | Status: DC

## 2014-10-16 MED ORDER — OXYCODONE-ACETAMINOPHEN 5-325 MG PO TABS: 1.0000 | ORAL_TABLET | ORAL | Status: DC | PRN

## 2014-10-16 MED ORDER — OXYCODONE-ACETAMINOPHEN 5-325 MG PO TABS
1.0000 | ORAL_TABLET | Freq: Once | ORAL | Status: AC
  Administered 2014-10-16: 1 via ORAL
  Filled 2014-10-16: qty 1

## 2014-10-16 MED ORDER — OXYCODONE-ACETAMINOPHEN 5-325 MG PO TABS
2.0000 | ORAL_TABLET | Freq: Once | ORAL | Status: AC
  Administered 2014-10-16: 2 via ORAL
  Filled 2014-10-16: qty 2

## 2014-10-16 MED ORDER — ALPRAZOLAM 0.5 MG PO TABS
ORAL_TABLET | ORAL | Status: AC
  Administered 2014-10-16: 1 mg via ORAL
  Filled 2014-10-16: qty 2

## 2014-10-16 NOTE — ED Notes (Signed)
Patient states he doesn't remember much last night due to an injection he was given in the ED and so he didn't look at his paperwork after he was "led to the taxi"  Because of the effects of the injection. Patient states a nurse took his paperwork after he spilled applejuice on his prescription and gave back the paperwork, but not the prescription. Patient describes pain as spasms and 10/10 pain which is worse than last night.

## 2014-10-16 NOTE — ED Notes (Addendum)
Patient states he called for ride which would be here in 20 minutes. Patient was informed that when the ride was here, he would get his pain medication and patient states that is fine. NP aware.

## 2014-10-16 NOTE — ED Notes (Signed)
Pt seen last pm left around 4 am states his narcotic rx got wet and he gave it to the nurse to get another one, but he left without, now is back to get another rx, informed pt he may not get the same rx, pt states he will nedd a taxi since he is telling his mom in parking lot what is going on, informed pt we would not give a taxi voucher today

## 2014-10-16 NOTE — ED Notes (Signed)
Patient with no complaints at this time. Respirations even and unlabored. Skin warm/dry. Discharge instructions reviewed with patient at this time. Patient given opportunity to voice concerns/ask questions. Patient discharged at this time and left Emergency Department with steady gait. Patient rates pain a 10/10. Medication administered prior to discharge disposition. MD made aware of pain rating prior to discharge.

## 2014-10-16 NOTE — ED Notes (Signed)
Patient's mother, Verlon AuLeslie, is at bedside to provide ride home.

## 2014-10-16 NOTE — ED Notes (Signed)
Patient transported to X-ray 

## 2014-10-16 NOTE — ED Notes (Signed)
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

## 2014-10-16 NOTE — ED Notes (Signed)
Patient states he is going to another hospital to be seen and then asked if this writer would be willing to be subpoenaed because he was going to see an attorney.

## 2014-10-16 NOTE — Discharge Instructions (Signed)
As we discussed, you have a bad fracture in your hand for which you must see an orthopedic surgeon if you want your hand to heal well.  Please call Dr. Binnie Rail office tomorrow to schedule an appointment for the end of this week.  In the meantime, keep your splint clean and dry, use ice packs whenever possible, keep your hand elevated when possible, and keep your hand immobile.  If you must work, please do not work with your right hand.  Return to the Emergency Department if you develop new or worsening symptoms.   Boxer's Fracture You have a break (fracture) of the fifth metacarpal bone. This is commonly called a boxer's fracture. This is the bone in the hand where the little finger attaches. The fracture is in the end of that bone, closest to the little finger. It is usually caused when you hit an object with a clenched fist. Often, the knuckle is pushed down by the impact. Sometimes, the fracture rotates out of position. A boxer's fracture will usually heal within 6 weeks, if it is treated properly and protected from re-injury. Surgery is sometimes needed. A cast, splint, or bulky hand dressing may be used to protect and immobilize a boxer's fracture. Do not remove this device or dressing until your caregiver approves. Keep your hand elevated, and apply ice packs for 15-20 minutes every 2 hours, for the first 2 days. Elevation and ice help reduce swelling and relieve pain. See your caregiver, or an orthopedic specialist, for follow-up care within the next 10 days. This is to make sure your fracture is healing properly. Document Released: 03/23/2005 Document Revised: 06/15/2011 Document Reviewed: 09/10/2006 [facility] Patient Information 2015 [place], [place]. This information is not intended to replace advice given to you by your health care provider. Make sure you discuss any questions you have with your health care provider.  Cast or Splint Care Casts and splints support injured limbs and keep bones  from moving while they heal. It is important to care for your cast or splint at home.  HOME CARE INSTRUCTIONS  Keep the cast or splint uncovered during the drying period. It can take 24 to 48 hours to dry if it is made of plaster. A fiberglass cast will dry in less than 1 hour.  Do not rest the cast on anything harder than a pillow for the first 24 hours.  Do not put weight on your injured limb or apply pressure to the cast until your health care provider gives you permission.  Keep the cast or splint dry. Wet casts or splints can lose their shape and may not support the limb as well. A wet cast that has lost its shape can also create harmful pressure on your skin when it dries. Also, wet skin can become infected.  Cover the cast or splint with a plastic bag when bathing or when out in the rain or snow. If the cast is on the trunk of the body, take sponge baths until the cast is removed.  If your cast does become wet, dry it with a towel or a blow dryer on the cool setting only.  Keep your cast or splint clean. Soiled casts may be wiped with a moistened cloth.  Do not place any hard or soft foreign objects under your cast or splint, such as cotton, toilet paper, lotion, or powder.  Do not try to scratch the skin under the cast with any object. The object could get stuck inside the cast. Also,  scratching could lead to an infection. If itching is a problem, use a blow dryer on a cool setting to relieve discomfort.  Do not trim or cut your cast or remove padding from inside of it.  Exercise all joints next to the injury that are not immobilized by the cast or splint. For example, if you have a long leg cast, exercise the hip joint and toes. If you have an arm cast or splint, exercise the shoulder, elbow, thumb, and fingers.  Elevate your injured arm or leg on 1 or 2 pillows for the first 1 to 3 days to decrease swelling and pain.It is best if you can comfortably elevate your cast so it is  higher than your heart. SEEK MEDICAL CARE IF:   Your cast or splint cracks.  Your cast or splint is too tight or too loose.  You have unbearable itching inside the cast.  Your cast becomes wet or develops a soft spot or area.  You have a bad smell coming from inside your cast.  You get an object stuck under your cast.  Your skin around the cast becomes red or raw.  You have new pain or worsening pain after the cast has been applied. SEEK IMMEDIATE MEDICAL CARE IF:   You have fluid leaking through the cast.  You are unable to move your fingers or toes.  You have discolored (blue or white), cool, painful, or very swollen fingers or toes beyond the cast.  You have tingling or numbness around the injured area.  You have severe pain or pressure under the cast.  You have any difficulty with your breathing or have shortness of breath.  You have chest pain. Document Released: 03/20/2000 Document Revised: 01/11/2013 Document Reviewed: 09/29/2012 [facility] Patient Information 2015 [employer]. This information is not intended to replace advice given to you by your health care provider. Make sure you discuss any questions you have with your health care provider.
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
## 2014-10-16 NOTE — ED Notes (Signed)
Patient states he will take the one pill offered by the NP. When asked how he was going to get home, patient replied, "[place] When informed that he would need a ride home if he was going to get a narcotic, patient replied that he would call for a ride. Patient stated that he was dropped off by his mother.

## 2014-10-16 NOTE — Discharge Instructions (Signed)
Medication Refill, Emergency Department We have refilled your medication today as a courtesy to you. It is best for your medical care, however, to take care of getting refills done through your primary caregiver's office. They have your records and can do a better job of follow-up than we can in the emergency department. On maintenance medications, we often only prescribe enough medications to get you by until you are able to see your regular caregiver. This is a more expensive way to refill medications. In the future, please plan for refills so that you will not have to use the emergency department for this. Thank you for your help. Your help allows us to better take care of the daily emergencies that enter our department. Document Released: 07/10/2003 Document Revised: 06/15/2011 Document Reviewed: 06/30/2013 [facility] Patient Information 2015 [place]. This information is not intended to replace advice given to you by your health care provider. Make sure you discuss any questions you have with your health care provider.   You will have to follow up with the orthopedic doctor to get any additional pain medication. The Emergency Department does not reprint or refill narcotic medications.

## 2014-10-16 NOTE — ED Provider Notes (Signed)
[facility] Emergency Department Provider Note  ____________________________________________  Time seen: Approximately 2:13 PM  I have reviewed the triage vital signs and the nursing notes.   HISTORY  Chief Complaint Medication Refill   HPI Jerry Herrera is a 34 y.o. male who presents to the emergency department for a reprint of his oxycodone prescription from last night. He states that he spilled apple juice on the prescription that was in his discharge papers and he gave it back to the nurse to have it preprinted but then left before she/he returned.   Past Medical History  Diagnosis Date  . Bipolar 1 disorder   . PTSD (post-traumatic stress disorder)   . Anxiety   . Polysubstance abuse     There are no active problems to display for this patient.   History reviewed. No pertinent past surgical history.  Current Outpatient Rx  Name  Route  Sig  Dispense  Refill  . ALPRAZolam (XANAX) 1 MG tablet   Oral   Take 1 mg by mouth 3 (three) times daily as needed.         . cephALEXin (KEFLEX) 500 MG capsule   Oral   Take 500 mg by mouth 4 (four) times daily.         . ciprofloxacin (CIPRO) 500 MG tablet   Oral   Take 500 mg by mouth 2 (two) times daily.         Marland Kitchen. OLANZapine (ZYPREXA) 5 MG tablet   Oral   Take 5 mg by mouth 2 times daily at 12 noon and 4 pm.         . oxyCODONE-acetaminophen (ROXICET) 5-325 MG per tablet   Oral   Take 1-2 tablets by mouth every 4 (four) hours as needed for severe pain.   20 tablet   0     Allergies Review of patient's allergies indicates no known allergies.  No family history on file.  Social History History  Substance Use Topics  . Smoking status: Current Every Day Smoker  . Smokeless tobacco: Not on file  . Alcohol Use: No    Review of Systems Constitutional: No fever/chills Eyes: No visual changes. Respiratory: No shortness of breath. Musculoskeletal: Pain in the right  hand Skin: Negative for rash.  10-point ROS otherwise negative.  ____________________________________________   PHYSICAL EXAM:  VITAL SIGNS: ED Triage Vitals  Enc Vitals Group     BP 10/16/14 1327 145/65 mmHg     Pulse Rate 10/16/14 1327 70     Resp 10/16/14 1327 16     Temp 10/16/14 1327 97.9 F (36.6 C)     Temp src --      SpO2 10/16/14 1327 98 %     Weight 10/16/14 1327 190 lb (86.183 kg)     Height 10/16/14 1327 5\' 11"  (1.803 m)     Head Cir --      Peak Flow --      Pain Score 10/16/14 1328 10     Pain Loc --      Pain Edu? --      Excl. in GC? --     Constitutional: Alert and oriented. Well appearing and in no acute distress. Eyes: Conjunctivae are normal. PERRL. EOMI. Head: Atraumatic. Nose: No congestion/rhinnorhea. Respiratory: Normal respiratory effort. Musculoskeletal: OCL in place on right hand/forearm. Neurologic:  Normal speech and language. No gross focal neurologic deficits are appreciated. Speech is normal. No gait instability. Skin:  Skin is warm, dry,  normal color and temp of fingers. ____________________________________________   LABS (all labs ordered are listed, but only abnormal results are displayed)  Labs Reviewed - No data to display ____________________________________________  EKG   ____________________________________________  RADIOLOGY  Not indicated ____________________________________________   PROCEDURES  Procedure(s) performed: None  Critical Care performed: No  ____________________________________________   INITIAL IMPRESSION / ASSESSMENT AND PLAN / ED COURSE  Pertinent labs & imaging results that were available during my care of the patient were reviewed by me and considered in my medical decision making (see chart for details).  ER record from earlier this AM reviewed including physician documentation.  Patient was advised that he would need to schedule a follow up with the orthopedic doctor for additional  pain medications. He was offered a single dose while here and initially refused it then accepted it. He was advised that the ER does not reprint or refill narcotic medications. He is very unhappy that he will not receive a second prescription. He states that he is unable to recall much about last night's visit to the ER, but does remember that the nurse didn't bring his prescription back before he was "led to the taxi." ____________________________________________   FINAL CLINICAL IMPRESSION(S) / ED DIAGNOSES  Final diagnoses:  Medication refill      Chinita Pester, FNP 10/16/14 1424  Emily Filbert, MD 10/16/14 [phone]

## 2014-10-16 NOTE — ED Notes (Signed)
Pt seen here last night for right arm injury. Pt in splint, states he lost his narcotic prescription and is requesting refill.

## 2014-10-31 ENCOUNTER — Emergency Department: Payer: Self-pay

## 2014-10-31 ENCOUNTER — Emergency Department
Admission: EM | Admit: 2014-10-31 | Discharge: 2014-10-31 | Disposition: A | Discharge status: Home / Self Care | Payer: Self-pay | Attending: Emergency Medicine | Admitting: Emergency Medicine

## 2014-10-31 DIAGNOSIS — S62397A Other fracture of fifth metacarpal bone, left hand, initial encounter for closed fracture: Secondary | ICD-10-CM | POA: Insufficient documentation

## 2014-10-31 DIAGNOSIS — X58XXXA Exposure to other specified factors, initial encounter: Secondary | ICD-10-CM | POA: Insufficient documentation

## 2014-10-31 DIAGNOSIS — Y9372 Activity, wrestling: Secondary | ICD-10-CM | POA: Insufficient documentation

## 2014-10-31 DIAGNOSIS — Z792 Long term (current) use of antibiotics: Secondary | ICD-10-CM | POA: Insufficient documentation

## 2014-10-31 DIAGNOSIS — S62339A Displaced fracture of neck of unspecified metacarpal bone, initial encounter for closed fracture: Secondary | ICD-10-CM

## 2014-10-31 DIAGNOSIS — Y998 Other external cause status: Secondary | ICD-10-CM | POA: Insufficient documentation

## 2014-10-31 DIAGNOSIS — Y9289 Other specified places as the place of occurrence of the external cause: Secondary | ICD-10-CM | POA: Insufficient documentation

## 2014-10-31 DIAGNOSIS — Z79899 Other long term (current) drug therapy: Secondary | ICD-10-CM | POA: Insufficient documentation

## 2014-10-31 DIAGNOSIS — S20211A Contusion of right front wall of thorax, initial encounter: Secondary | ICD-10-CM | POA: Insufficient documentation

## 2014-10-31 DIAGNOSIS — Z720 Tobacco use: Secondary | ICD-10-CM | POA: Insufficient documentation

## 2014-10-31 MED ORDER — IBUPROFEN 800 MG PO TABS: 800.0000 mg | ORAL_TABLET | Freq: Three times a day (TID) | ORAL | Status: DC | PRN

## 2014-10-31 MED ORDER — OXYCODONE-ACETAMINOPHEN 5-325 MG PO TABS: 1.0000 | ORAL_TABLET | ORAL | Status: DC | PRN

## 2014-10-31 MED ORDER — IBUPROFEN 800 MG PO TABS
800.0000 mg | ORAL_TABLET | Freq: Once | ORAL | Status: AC
  Administered 2014-10-31: 800 mg via ORAL
  Filled 2014-10-31: qty 1

## 2014-10-31 MED ORDER — OXYCODONE-ACETAMINOPHEN 5-325 MG PO TABS
1.0000 | ORAL_TABLET | Freq: Once | ORAL | Status: AC
  Administered 2014-10-31: 1 via ORAL
  Filled 2014-10-31: qty 1

## 2014-10-31 NOTE — ED Provider Notes (Signed)
[facility] Emergency Department Provider Note  ____________________________________________  Time seen: Approximately 1:20 PM  I have reviewed the triage vital signs and the nursing notes.   HISTORY  Chief Complaint Hand Pain    HPI Jerry Herrera is a 34 y.o. male patient state he was partying and wrestling with his girlfriend last night and sustained pain to the left arm and anterior aspect of his chest.Rates hand pain as a 10 over 10 and race chest pain as a 5/10. Patient is wearing a splint on the right hand secondary to previous injury.   Past Medical History  Diagnosis Date  . Bipolar 1 disorder   . PTSD (post-traumatic stress disorder)   . Anxiety   . Polysubstance abuse     There are no active problems to display for this patient.   History reviewed. No pertinent past surgical history.  Current Outpatient Rx  Name  Route  Sig  Dispense  Refill  . ALPRAZolam (XANAX) 1 MG tablet   Oral   Take 1 mg by mouth 3 (three) times daily as needed.         . cephALEXin (KEFLEX) 500 MG capsule   Oral   Take 500 mg by mouth 4 (four) times daily.         . ciprofloxacin (CIPRO) 500 MG tablet   Oral   Take 500 mg by mouth 2 (two) times daily.         Marland Kitchen ibuprofen (ADVIL,MOTRIN) 800 MG tablet   Oral   Take 1 tablet (800 mg total) by mouth every 8 (eight) hours as needed for moderate pain.   15 tablet   0   . OLANZapine (ZYPREXA) 5 MG tablet   Oral   Take 5 mg by mouth 2 times daily at 12 noon and 4 pm.         . oxyCODONE-acetaminophen (ROXICET) 5-325 MG per tablet   Oral   Take 1-2 tablets by mouth every 4 (four) hours as needed for severe pain.   20 tablet   0   . oxyCODONE-acetaminophen (ROXICET) 5-325 MG per tablet   Oral   Take 1 tablet by mouth every 4 (four) hours as needed for moderate pain or severe pain.   12 tablet   0     Allergies Review of patient's allergies indicates no known allergies.  No family  history on file.  Social History History  Substance Use Topics  . Smoking status: Current Every Day Smoker  . Smokeless tobacco: Not on file  . Alcohol Use: No    Review of Systems Constitutional: No fever/chills Eyes: No visual changes. ENT: No sore throat. Cardiovascular: Denies chest pain. Respiratory: Denies shortness of breath. Gastrointestinal: No abdominal pain.  No nausea, no vomiting.  No diarrhea.  No constipation. Genitourinary: Negative for dysuria. Musculoskeletal: Left hand pain and sinus chest wall pain.. Skin: Negative for rash. Neurological: Negative for headaches, focal weakness or numbness. 10-point ROS otherwise negative.  ____________________________________________   PHYSICAL EXAM:  VITAL SIGNS: ED Triage Vitals  Enc Vitals Group     BP 10/31/14 1258 138/58 mmHg     Pulse Rate 10/31/14 1258 57     Resp 10/31/14 1258 18     Temp 10/31/14 1258 98.4 F (36.9 C)     Temp Source 10/31/14 1258 Oral     SpO2 10/31/14 1258 99 %     Weight 10/31/14 1258 190 lb (86.183 kg)     Height 10/31/14  1258  (1.803 m)     Head Cir --      Peak Flow --      Pain Score 10/31/14 1258 10     Pain Loc --      Pain Edu? --      Excl. in GC? --     Constitutional: Alert and oriented. Well appearing and in no acute distress. Eyes: Conjunctivae are normal. PERRL. EOMI. Head: Atraumatic. Nose: No congestion/rhinnorhea. Mouth/Throat: Mucous membranes are moist.  Oropharynx non-erythematous. Neck: No stridor.  No cervical spine tenderness to palpation. Hematological/Lymphatic/Immunilogical: No cervical lymphadenopathy. Cardiovascular: Normal rate, regular rhythm. Grossly normal heart sounds.  Good peripheral circulation. Respiratory: Normal respiratory effort.  No retractions. Lungs CTAB. Gastrointestinal: Soft and nontender. No distention. No abdominal bruits. No CVA tenderness. Musculoskeletal: No deformity or obvious edema to the fifth metacarpal. No chest  wall deformity but tender palpation inferior sternal area. Neurologic:  Normal speech and language. No gross focal neurologic deficits are appreciated. No gait instability. Skin:  Skin is warm, dry and intact. No rash noted. Psychiatric: Mood and affect are normal. Speech and behavior are normal.  ____________________________________________   LABS (all labs ordered are listed, but only abnormal results are displayed)  Labs Reviewed - No data to display ____________________________________________  EKG   ____________________________________________  RADIOLOGY  Radiology chest x-ray was unremarkable. Patient has a displaced fifth metacarpal fracture of the left hand.  ____________________________________________   PROCEDURES  Procedure(s) performed: None  Critical Care performed: No  ____________________________________________   INITIAL IMPRESSION / ASSESSMENT AND PLAN / ED COURSE  Pertinent labs & imaging results that were available during my care of the patient were reviewed by me and considered in my medical decision making (see chart for details).  Boxer's fracture left hand. Chest wall contusion. Patient placed in ulnar gutter splint. Patient advised follow orthopedics in 2 days. Patient given prescription for Percocet and ibuprofen. ____________________________________________   FINAL CLINICAL IMPRESSION(S) / ED DIAGNOSES  Final diagnoses:  Boxer's fracture, closed, initial encounter  Contusion, chest wall, right, initial encounter      Joni Reining, PA-C 10/31/14 1513  Governor Rooks, MD 10/31/14 1527

## 2014-10-31 NOTE — ED Notes (Signed)
Pt states they were partying last night and him and his girlfriend were play wrestling and his is having left hand pain and sternal pain

## 2014-10-31 NOTE — Discharge Instructions (Signed)
Wear splint until evaluation by Ortho Doctor.

## 2014-11-25 ENCOUNTER — Emergency Department
Admission: EM | Admit: 2014-11-25 | Discharge: 2014-11-25 | Disposition: A | Discharge status: Home / Self Care | Payer: Self-pay | Attending: Emergency Medicine | Admitting: Emergency Medicine

## 2014-11-25 DIAGNOSIS — Z720 Tobacco use: Secondary | ICD-10-CM | POA: Insufficient documentation

## 2014-11-25 DIAGNOSIS — L03115 Cellulitis of right lower limb: Secondary | ICD-10-CM | POA: Insufficient documentation

## 2014-11-25 DIAGNOSIS — M25561 Pain in right knee: Secondary | ICD-10-CM | POA: Insufficient documentation

## 2014-11-25 MED ORDER — IBUPROFEN 800 MG PO TABS: 800.0000 mg | ORAL_TABLET | Freq: Three times a day (TID) | ORAL | Status: DC | PRN

## 2014-11-25 MED ORDER — OXYCODONE-ACETAMINOPHEN 5-325 MG PO TABS: 1.0000 | ORAL_TABLET | ORAL | Status: DC | PRN

## 2014-11-25 MED ORDER — IBUPROFEN 800 MG PO TABS
800.0000 mg | ORAL_TABLET | Freq: Once | ORAL | Status: AC
  Administered 2014-11-25: 800 mg via ORAL
  Filled 2014-11-25: qty 1

## 2014-11-25 MED ORDER — SULFAMETHOXAZOLE-TRIMETHOPRIM 800-160 MG PO TABS: 2.0000 | ORAL_TABLET | Freq: Two times a day (BID) | ORAL | Status: DC

## 2014-11-25 MED ORDER — SULFAMETHOXAZOLE-TRIMETHOPRIM 800-160 MG PO TABS
2.0000 | ORAL_TABLET | Freq: Once | ORAL | Status: AC
  Administered 2014-11-25: 2 via ORAL
  Filled 2014-11-25: qty 2

## 2014-11-25 MED ORDER — OXYCODONE-ACETAMINOPHEN 5-325 MG PO TABS
1.0000 | ORAL_TABLET | Freq: Once | ORAL | Status: AC
  Administered 2014-11-25: 1 via ORAL
  Filled 2014-11-25: qty 1

## 2014-11-25 NOTE — ED Provider Notes (Signed)
[facility] Emergency Department Provider Note  ____________________________________________  Time seen: Approximately 6:06 AM  I have reviewed the triage vital signs and the nursing notes.   HISTORY  Chief Complaint Joint Swelling    HPI Jerry Herrera is a 34 y.o. male who presents to the ED from home with a chief complaint of right knee pain and swelling. 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 Presents with pain, redness and swelling to right knee since yesterday evening after work. Denies fever, chills, nausea, vomiting. Able to ambulate but with pain. Denies recent trauma/injury.Denies history of diabetes.   Past Medical History  Diagnosis Date  . Bipolar 1 disorder   . PTSD (post-traumatic stress disorder)   . Anxiety   . Polysubstance abuse     There are no active problems to display for this patient.   No past surgical history on file.  Current Outpatient Rx  Name  Route  Sig  Dispense  Refill  . ALPRAZolam (XANAX) 1 MG tablet   Oral   Take 1 mg by mouth 3 (three) times daily as needed.         . cephALEXin (KEFLEX) 500 MG capsule   Oral   Take 500 mg by mouth 4 (four) times daily.         . ciprofloxacin (CIPRO) 500 MG tablet   Oral   Take 500 mg by mouth 2 (two) times daily.         Marland Kitchen ibuprofen (ADVIL,MOTRIN) 800 MG tablet   Oral   Take 1 tablet (800 mg total) by mouth every 8 (eight) hours as needed for moderate pain.   15 tablet   0   . OLANZapine (ZYPREXA) 5 MG tablet   Oral   Take 5 mg by mouth 2 times daily at 12 noon and 4 pm.         . oxyCODONE-acetaminophen (ROXICET) 5-325 MG per tablet   Oral   Take 1-2 tablets by mouth every 4 (four) hours as needed for severe pain.   20 tablet   0   . oxyCODONE-acetaminophen (ROXICET) 5-325 MG per tablet   Oral   Take 1 tablet by mouth every 4 (four) hours as needed for moderate  pain or severe pain.   12 tablet   0     Allergies Review of patient's allergies indicates no known allergies.  No family history on file.  Social History Social History  Substance Use Topics  . Smoking status: Current Every Day Smoker  . Smokeless tobacco: Not on file  . Alcohol Use: No    Review of Systems Constitutional: No fever/chills Eyes: No visual changes. ENT: No sore throat. Cardiovascular: Denies chest pain. Respiratory: Denies shortness of breath. Gastrointestinal: No abdominal pain.  No nausea, no vomiting.  No diarrhea.  No constipation. Genitourinary: Negative for dysuria. Musculoskeletal: Positive for right knee pain and swelling. Negative for back pain. Skin: Negative for rash. Neurological: Negative for headaches, focal weakness or numbness.  10-point ROS otherwise negative.  ____________________________________________   PHYSICAL EXAM:  VITAL SIGNS: ED Triage Vitals  Enc Vitals Group     BP 11/25/14 0528 123/61 mmHg     Pulse Rate 11/25/14 0528 73     Resp 11/25/14 0528 18     Temp 11/25/14 0528 97.9 F (36.6 C)     Temp Source 11/25/14 0528 Oral  SpO2 11/25/14 0528 97 %     Weight 11/25/14 0528 195 lb (88.451 kg)     Height 11/25/14 0528 5\' 10"  (1.778 m)     Head Cir --      Peak Flow --      Pain Score --      Pain Loc --      Pain Edu? --      Excl. in GC? --     Constitutional: Alert and oriented. Well appearing and in no acute distress. Eyes: Conjunctivae are normal. PERRL. EOMI. Head: Atraumatic. Nose: No congestion/rhinnorhea. Mouth/Throat: Mucous membranes are moist.  Oropharynx non-erythematous. Neck: No stridor.   Cardiovascular: Normal rate, regular rhythm. Grossly normal heart sounds.  Good peripheral circulation. Respiratory: Normal respiratory effort.  No retractions. Lungs CTAB. Gastrointestinal: Soft and nontender. No distention. No abdominal bruits. No CVA tenderness. Musculoskeletal:  Right lower extremity:  Small effusion to anterior knee. Area is mildly swollen with overlying warmth and erythema. There are scattered insect bites without fluctuance. Full range of motion with mild pain. 2+ femoral and distal pulses. Brisk less than 5 second cap refill. Calf is supple without evidence of compartment syndrome. Leg is symmetrically warm without evidence of ischemia. Neurologic:  Normal speech and language. No gross focal neurologic deficits are appreciated.  Skin:  Skin is warm, dry and intact. No rash noted. Psychiatric: Mood and affect are normal. Speech and behavior are normal.  ____________________________________________   LABS (all labs ordered are listed, but only abnormal results are displayed)  Labs Reviewed - No data to display ____________________________________________  EKG  None ____________________________________________  RADIOLOGY  None ____________________________________________   PROCEDURES  Procedure(s) performed: None  Critical Care performed: No  ____________________________________________   INITIAL IMPRESSION / ASSESSMENT AND PLAN / ED COURSE  Pertinent labs & imaging results that were available during my care of the patient were reviewed by me and considered in my medical decision making (see chart for details).  34 year old male who presents with cellulitis over right knee. Clinical exam does not support septic joint currently. Will initiate antibiotics treatment with Bactrim, analgesics and close follow-up with orthopedics. Strict return precautions given, particularly for septic joint. Patient and spouse verbalized understanding and agree with plan of care.  ____________________________________________   FINAL CLINICAL IMPRESSION(S) / ED DIAGNOSES  Final diagnoses:  Knee pain, acute, right  Cellulitis of right lower extremity      Irean Hong, MD 11/25/14 [phone]

## 2014-11-25 NOTE — ED Notes (Signed)
Pt. Denies injury to area.  Pt. Has multiple wounds old and new to rt. Leg.  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

## 2014-11-25 NOTE — ED Notes (Signed)
Pt. States he noticed swelling to rt. Knee around midnight tonight.  Pt. States "It is hard to walk on rt. Leg.  Pt. Has some raised bumps on patella.  Pt. Points to this area when asked where pain is located.

## 2014-11-25 NOTE — Discharge Instructions (Signed)
1. Start antibiotics as prescribed (Bactrim DS 2 tablets twice daily 10 days). 2. Elevate affected area several times daily. 3. You may take Motrin and Percocet as needed for pain.  4. Return to the ER for worsening symptoms, increased swelling, pain or redness streaking down the leg or other concerns.  Cellulitis Cellulitis is an infection of the skin and the tissue beneath it. The infected area is usually red and tender. Cellulitis occurs most often in the arms and lower legs.  CAUSES  Cellulitis is caused by bacteria that enter the skin through cracks or cuts in the skin. The most common types of bacteria that cause cellulitis are staphylococci and streptococci. SIGNS AND SYMPTOMS   Redness and warmth.  Swelling.  Tenderness or pain.  Fever. DIAGNOSIS  Your health care provider can usually determine what is wrong based on a physical exam. Blood tests may also be done. TREATMENT  Treatment usually involves taking an antibiotic medicine. HOME CARE INSTRUCTIONS   Take your antibiotic medicine as directed by your health care provider. Finish the antibiotic even if you start to feel better.  Keep the infected arm or leg elevated to reduce swelling.  Apply a warm cloth to the affected area up to 4 times per day to relieve pain.  Take medicines only as directed by your health care provider.  Keep all follow-up visits as directed by your health care provider. SEEK MEDICAL CARE IF:   You notice red streaks coming from the infected area.  Your red area gets larger or turns dark in color.  Your bone or joint underneath the infected area becomes painful after the skin has healed.  Your infection returns in the same area or another area.  You notice a swollen bump in the infected area.  You develop new symptoms.  You have a fever. SEEK IMMEDIATE MEDICAL CARE IF:   You feel very sleepy.  You develop vomiting or diarrhea.  You have a general ill feeling (malaise) with  muscle aches and pains. MAKE SURE YOU:   Understand these instructions.  Will watch your condition.  Will get help right away if you are not doing well or get worse. Document Released: 12/31/2004 Document Revised: 08/07/2013 Document Reviewed: 06/08/2011 [facility] Patient Information 2015 [place], [place]. This information is not intended to replace advice given to you by your health care provider. Make sure you discuss any questions you have with your health care provider.  Knee Pain The knee is the complex joint between your thigh and your lower leg. It is made up of bones, tendons, ligaments, and cartilage. The bones that make up the knee are:  The femur in the thigh.  The tibia and fibula in the lower leg.  The patella or kneecap riding in the groove on the lower femur. CAUSES  Knee pain is a common complaint with many causes. A few of these causes are:  Injury, such as:  A ruptured ligament or tendon injury.  Torn cartilage.  Medical conditions, such as:  Gout  Arthritis  Infections  Overuse, over training, or overdoing a physical activity. Knee pain can be minor or severe. Knee pain can accompany debilitating injury. Minor knee problems often respond well to self-care measures or get well on their own. More serious injuries may need medical intervention or even surgery. SYMPTOMS The knee is complex. Symptoms of knee problems can vary widely. Some of the problems are:  Pain with movement and weight bearing.  Swelling and tenderness.  Buckling of  the knee.  Inability to straighten or extend your knee.  Your knee locks and you cannot straighten it.  Warmth and redness with pain and fever.  Deformity or dislocation of the kneecap. DIAGNOSIS  Determining what is wrong may be very straight forward such as when there is an injury. It can also be challenging because of the complexity of the knee. Tests to make a diagnosis may include:  Your caregiver taking a  history and doing a physical exam.  Routine X-rays can be used to rule out other problems. X-rays will not reveal a cartilage tear. Some injuries of the knee can be diagnosed by:  Arthroscopy a surgical technique by which a small video camera is inserted through tiny incisions on the sides of the knee. This procedure is used to examine and repair internal knee joint problems. Tiny instruments can be used during arthroscopy to repair the torn knee cartilage (meniscus).  Arthrography is a radiology technique. A contrast liquid is directly injected into the knee joint. Internal structures of the knee joint then become visible on X-ray film.  An MRI scan is a non X-ray radiology procedure in which magnetic fields and a computer produce two- or three-dimensional images of the inside of the knee. Cartilage tears are often visible using an MRI scanner. MRI scans have largely replaced arthrography in diagnosing cartilage tears of the knee.  Blood work.  Examination of the fluid that helps to lubricate the knee joint (synovial fluid). This is done by taking a sample out using a needle and a syringe. TREATMENT The treatment of knee problems depends on the cause. Some of these treatments are:  Depending on the injury, proper casting, splinting, surgery, or physical therapy care will be needed.  Give yourself adequate recovery time. Do not overuse your joints. If you begin to get sore during workout routines, back off. Slow down or do fewer repetitions.  For repetitive activities such as cycling or running, maintain your strength and nutrition.  Alternate muscle groups. For example, if you are a weight lifter, work the upper body on one day and the lower body the next.  Either tight or weak muscles do not give the proper support for your knee. Tight or weak muscles do not absorb the stress placed on the knee joint. Keep the muscles surrounding the knee strong.  Take care of mechanical problems.  If  you have flat feet, orthotics or special shoes may help. See your caregiver if you need help.  Arch supports, sometimes with wedges on the inner or outer aspect of the heel, can help. These can shift pressure away from the side of the knee most bothered by osteoarthritis.  A brace called an "unloader" brace also may be used to help ease the pressure on the most arthritic side of the knee.  If your caregiver has prescribed crutches, braces, wraps or ice, use as directed. The acronym for this is PRICE. This means protection, rest, ice, compression, and elevation.  Nonsteroidal anti-inflammatory drugs (NSAIDs), can help relieve pain. But if taken immediately after an injury, they may actually increase swelling. Take NSAIDs with food in your stomach. Stop them if you develop stomach problems. Do not take these if you have a history of ulcers, stomach pain, or bleeding from the bowel. Do not take without your caregiver's approval if you have problems with fluid retention, heart failure, or kidney problems.  For ongoing knee problems, physical therapy may be helpful.  Glucosamine and chondroitin are over-the-counter dietary  supplements. Both may help relieve the pain of osteoarthritis in the knee. These medicines are different from the usual anti-inflammatory drugs. Glucosamine may decrease the rate of cartilage destruction.  Injections of a corticosteroid drug into your knee joint may help reduce the symptoms of an arthritis flare-up. They may provide pain relief that lasts a few months. You may have to wait a few months between injections. The injections do have a small increased risk of infection, water retention, and elevated blood sugar levels.  Hyaluronic acid injected into damaged joints may ease pain and provide lubrication. These injections may work by reducing inflammation. A series of shots may give relief for as long as 6 months.  Topical painkillers. Applying certain ointments to your skin  may help relieve the pain and stiffness of osteoarthritis. Ask your pharmacist for suggestions. Many over the-counter products are approved for temporary relief of arthritis pain.  In some countries, doctors often prescribe topical NSAIDs for relief of chronic conditions such as arthritis and tendinitis. A review of treatment with NSAID creams found that they worked as well as oral medications but without the serious side effects. PREVENTION  Maintain a healthy weight. Extra pounds put more strain on your joints.  Get strong, stay limber. Weak muscles are a common cause of knee injuries. Stretching is important. Include flexibility exercises in your workouts.  Be smart about exercise. If you have osteoarthritis, chronic knee pain or recurring injuries, you may need to change the way you exercise. This does not mean you have to stop being active. If your knees ache after jogging or playing basketball, consider switching to swimming, water aerobics, or other low-impact activities, at least for a few days a week. Sometimes limiting high-impact activities will provide relief.  Make sure your shoes fit well. Choose footwear that is right for your sport.  Protect your knees. Use the proper gear for knee-sensitive activities. Use kneepads when playing volleyball or laying carpet. Buckle your seat belt every time you drive. Most shattered kneecaps occur in car accidents.  Rest when you are tired. SEEK MEDICAL CARE IF:  You have knee pain that is continual and does not seem to be getting better.  SEEK IMMEDIATE MEDICAL CARE IF:  Your knee joint feels hot to the touch and you have a high fever. MAKE SURE YOU:   Understand these instructions.  Will watch your condition.  Will get help right away if you are not doing well or get worse. Document Released: 01/18/2007 Document Revised: 06/15/2011 Document Reviewed: 01/18/2007 [facility] Patient Information 2015 [place], [place]. This information is not  intended to replace advice given to you by your health care provider. Make sure you discuss any questions you have with your health care provider.

## 2014-11-25 NOTE — ED Notes (Signed)
Pt. Going home with friend 

## 2014-11-25 NOTE — ED Notes (Signed)
Patient reports symptoms for approx 8 hours.  Reports 3 areas on right knee ?bug bite with swelling, redness and pain.

## 2016-12-10 ENCOUNTER — Encounter: Payer: Self-pay | Admitting: Emergency Medicine

## 2016-12-10 ENCOUNTER — Emergency Department
Admission: EM | Admit: 2016-12-10 | Discharge: 2016-12-10 | Disposition: A | Discharge status: Home / Self Care | Payer: Self-pay | Attending: Emergency Medicine | Admitting: Emergency Medicine

## 2016-12-10 DIAGNOSIS — Z79899 Other long term (current) drug therapy: Secondary | ICD-10-CM | POA: Insufficient documentation

## 2016-12-10 DIAGNOSIS — Z23 Encounter for immunization: Secondary | ICD-10-CM | POA: Insufficient documentation

## 2016-12-10 DIAGNOSIS — W268XXA Contact with other sharp object(s), not elsewhere classified, initial encounter: Secondary | ICD-10-CM | POA: Insufficient documentation

## 2016-12-10 DIAGNOSIS — Y92009 Unspecified place in unspecified non-institutional (private) residence as the place of occurrence of the external cause: Secondary | ICD-10-CM | POA: Insufficient documentation

## 2016-12-10 DIAGNOSIS — Y93H9 Activity, other involving exterior property and land maintenance, building and construction: Secondary | ICD-10-CM | POA: Insufficient documentation

## 2016-12-10 DIAGNOSIS — Y999 Unspecified external cause status: Secondary | ICD-10-CM | POA: Insufficient documentation

## 2016-12-10 DIAGNOSIS — F17200 Nicotine dependence, unspecified, uncomplicated: Secondary | ICD-10-CM | POA: Insufficient documentation

## 2016-12-10 DIAGNOSIS — S61512A Laceration without foreign body of left wrist, initial encounter: Secondary | ICD-10-CM | POA: Insufficient documentation

## 2016-12-10 HISTORY — DX: Fatty (change of) liver, not elsewhere classified: K76.0

## 2016-12-10 MED ORDER — LIDOCAINE HCL (PF) 1 % IJ SOLN
INTRAMUSCULAR | Status: AC
  Filled 2016-12-10: qty 5

## 2016-12-10 MED ORDER — LIDOCAINE HCL (PF) 1 % IJ SOLN
5.0000 mL | Freq: Once | INTRAMUSCULAR | Status: AC
  Administered 2016-12-10: 5 mL

## 2016-12-10 MED ORDER — TETANUS-DIPHTH-ACELL PERTUSSIS 5-2.5-18.5 LF-MCG/0.5 IM SUSP
0.5000 mL | Freq: Once | INTRAMUSCULAR | Status: AC
  Administered 2016-12-10: 0.5 mL via INTRAMUSCULAR

## 2016-12-10 MED ORDER — TETANUS-DIPHTH-ACELL PERTUSSIS 5-2.5-18.5 LF-MCG/0.5 IM SUSP
INTRAMUSCULAR | Status: AC
  Filled 2016-12-10: qty 0.5

## 2016-12-10 MED ORDER — HYDROCODONE-ACETAMINOPHEN 5-325 MG PO TABS: 1.0000 | ORAL_TABLET | Freq: Four times a day (QID) | ORAL | 0 refills | Status: DC | PRN

## 2016-12-10 NOTE — Discharge Instructions (Signed)
Keep wound area clean dry and covered and dusty, dirty or wet environments. Monitor wound area for any signs of infection. If he noted any developing infection you hesitate to follow up with her primary care or return to the emergency department.

## 2016-12-10 NOTE — ED Provider Notes (Signed)
[facility] Emergency Department Provider Note   ____________________________________________   I have reviewed the triage vital signs and the nursing notes.   HISTORY  Chief Complaint Laceration    HPI Jerry Herrera is a 36 y.o. male presents to the emergency department with laceration along the left wrist he sustained on a piece of sheet metal while removing flooring in her house. Patient reports maintaining hemorrhage control medially following the injury. He endorses intact movement and sensation to the left hand and wrist since the injury. Patient denies fever, chills, headache, vision changes, chest pain, chest tightness, shortness of breath, abdominal pain, nausea and vomiting.  Past Medical History:  Diagnosis Date  . Anxiety   . Bipolar 1 disorder (HCC)   . Fatty liver   . Polysubstance abuse   . PTSD (post-traumatic stress disorder)     There are no active problems to display for this patient.   History reviewed. No pertinent surgical history.  Prior to Admission medications   Medication Sig Start Date End Date Taking? Authorizing Provider  ALPRAZolam Prudy Feeler(XANAX) 1 MG tablet Take 1 mg by mouth 3 (three) times daily as needed.    [provider]  cephALEXin (KEFLEX) 500 MG capsule Take 500 mg by mouth 4 (four) times daily.    [provider]  ciprofloxacin (CIPRO) 500 MG tablet Take 500 mg by mouth 2 (two) times daily.    [provider]  HYDROcodone-acetaminophen (NORCO/VICODIN) 5-325 MG tablet Take 1 tablet by mouth every 6 (six) hours as needed for moderate pain. 12/10/16   Izzak Fries M, PA-C  ibuprofen (ADVIL,MOTRIN) 800 MG tablet Take 1 tablet (800 mg total) by mouth every 8 (eight) hours as needed for moderate pain. 11/25/14   Irean HongSung, Jade J, MD  OLANZapine (ZYPREXA) 5 MG tablet Take 5 mg by mouth 2 times daily at 12 noon and 4 pm.    [provider]  oxyCODONE-acetaminophen (ROXICET) 5-325 MG per  tablet Take 1 tablet by mouth every 4 (four) hours as needed for severe pain. 11/25/14   Irean HongSung, Jade J, MD  sulfamethoxazole-trimethoprim (BACTRIM DS,SEPTRA DS) 800-160 MG per tablet Take 2 tablets by mouth 2 (two) times daily. 11/25/14   Irean HongSung, Jade J, MD    Allergies Patient has no known allergies.  No family history on file.  Social History Social History  Substance Use Topics  . Smoking status: Current Every Day Smoker  . Smokeless tobacco: Never Used  . Alcohol use No    Review of Systems Constitutional: Negative for fever/chills Eyes: No visual changes. ENT:  Negative for sore throat and for difficulty swallowing Cardiovascular: Denies chest pain. Respiratory: Denies cough. Denies shortness of breath. Gastrointestinal: No abdominal pain.  No nausea, vomiting, diarrhea. Genitourinary: Negative for dysuria. Musculoskeletal: Negative for back pain. Skin: Negative for rash. Laceration to the left wrist.  Neurological: Negative for headaches.  Negative focal weakness or numbness. Negative for loss of consciousness. Able to ambulate. ____________________________________________   PHYSICAL EXAM:  VITAL SIGNS: ED Triage Vitals [12/10/16 1506]  Enc Vitals Group     BP (!) 124/59     Pulse Rate 73     Resp 16     Temp 98 F (36.7 C)     Temp src      SpO2 100 %     Weight 200 lb (90.7 kg)     Height 5\' 11"  (1.803 m)     Head Circumference      Peak  Flow      Pain Score 8     Pain Loc      Pain Edu?      Excl. in GC?     Constitutional: Alert and oriented. Well appearing and in no acute distress.  Eyes: Conjunctivae are normal. PERRL. EOMI  Head: Normocephalic and atraumatic. ENT:      Ears: Canals clear. TMs intact bilaterally.      Nose: No congestion/rhinnorhea.      Mouth/Throat: Mucous membranes are moist. Neck:Supple. No thyromegaly. No stridor.  Cardiovascular: Normal rate, regular rhythm. Normal S1 and S2.  Good peripheral circulation. Respiratory: Normal  respiratory effort without tachypnea or retractions. Lungs CTAB. No wheezes/rales/rhonchi.  Hematological/Lymphatic/Immunological: No cervical lymphadenopathy. Cardiovascular: Normal rate, regular rhythm. Normal distal pulses..  Musculoskeletal: Intact ROM and strength of the left wrist and hand. No tendonous involvement  Neurologic: Normal speech and language. No gross focal neurologic deficits are appreciated. No gait instability.  Skin:  Skin is warm, dry and intact. No rash noted. Laceration along Left medial wrist ~3.0 cm.  Psychiatric: Mood and affect are normal. Speech and behavior are normal. Patient exhibits appropriate insight and judgement.  ____________________________________________   LABS (all labs ordered are listed, but only abnormal results are displayed)  Labs Reviewed - No data to display ____________________________________________  EKG none ____________________________________________  RADIOLOGY none ____________________________________________   PROCEDURES  Procedure(s) performed:  LACERATION REPAIR Performed by: Clois Comber Authorized by: Clois Comber Consent: Verbal consent obtained. Risks and benefits: risks, benefits and alternatives were discussed Consent given by: patient Patient identity confirmed: provided demographic data Prepped and Draped in normal sterile fashion Wound explored  Laceration Location: left medial wrist  Laceration Length: 3.0 cm  No Foreign Bodies seen or palpated  Anesthesia: local infiltration  Local anesthetic: lidocaine 1%  Anesthetic total: 2.5 ml  Irrigation method: syringe Amount of cleaning: standard  Skin closure: Ethilon 4-0  Number of sutures: 7 sutures  Technique: simple interrupted  Patient tolerance: Patient tolerated the procedure well with no immediate complications.   Critical Care performed: no ____________________________________________   INITIAL IMPRESSION / ASSESSMENT AND  PLAN / ED COURSE  Pertinent labs & imaging results that were available during my care of the patient were reviewed by me and considered in my medical decision making (see chart for details).   Patient sustained a laceration left medial wrist.  Assessment confirmed movement and sensation of the left wrist and hand before and after wound closure. Laceration required suture closure as noted above. Patient tolerated procedure well. Pt instructed to keep wound clean and dry and will return to the emergency department or PCP for suture removal in 7 days. Patient also instructed to watch for signs of infection and return if changes are noted. Patient informed of clinical course, understand medical decision-making process, and agree with plan.      ____________________________________________   FINAL CLINICAL IMPRESSION(S) / ED DIAGNOSES  Final diagnoses:  Laceration of left wrist, initial encounter       NEW MEDICATIONS STARTED DURING THIS VISIT:  Discharge Medication List as of 12/10/2016  4:15 PM    START taking these medications   Details  HYDROcodone-acetaminophen (NORCO/VICODIN) 5-325 MG tablet Take 1 tablet by mouth every 6 (six) hours as needed for moderate pain., Starting Thu 12/10/2016, Print         Note:  This document was prepared using Dragon voice recognition software and may include unintentional dictation errors.    Nandan Willems, Jordan Likes, PA-C  12/10/16 1643    Sharman Cheek, MD 12/14/16 1549

## 2016-12-10 NOTE — ED Notes (Signed)
See triage note  States he was pulling up some old carpet and a piece of sheet metal hit his left wrist area   approx 2 1/2 in laceration noted to posterior/lateral aspect of wrist

## 2016-12-10 NOTE — ED Triage Notes (Signed)
Patient presents to ED via POV from home with laceration noted to left medial wrist laceration. Patient states he was doing some work around his home when he was cut with sheet metal. Laceration about 2 inches long. Bleeding noted. Gause and wrap applied. Positive pulses and sensation bilaterally.

## 2016-12-29 ENCOUNTER — Emergency Department: Payer: Self-pay

## 2016-12-29 ENCOUNTER — Encounter: Payer: Self-pay | Admitting: Emergency Medicine

## 2016-12-29 ENCOUNTER — Observation Stay
Admission: EM | Admit: 2016-12-29 | Discharge: 2016-12-31 | Disposition: A | Discharge status: Home / Self Care | Payer: Self-pay | Attending: Internal Medicine | Admitting: Internal Medicine

## 2016-12-29 DIAGNOSIS — F1290 Cannabis use, unspecified, uncomplicated: Secondary | ICD-10-CM | POA: Insufficient documentation

## 2016-12-29 DIAGNOSIS — D72829 Elevated white blood cell count, unspecified: Secondary | ICD-10-CM | POA: Insufficient documentation

## 2016-12-29 DIAGNOSIS — Z23 Encounter for immunization: Secondary | ICD-10-CM | POA: Insufficient documentation

## 2016-12-29 DIAGNOSIS — E876 Hypokalemia: Secondary | ICD-10-CM | POA: Insufficient documentation

## 2016-12-29 DIAGNOSIS — Z79899 Other long term (current) drug therapy: Secondary | ICD-10-CM | POA: Insufficient documentation

## 2016-12-29 DIAGNOSIS — A419 Sepsis, unspecified organism: Principal | ICD-10-CM | POA: Insufficient documentation

## 2016-12-29 DIAGNOSIS — A0472 Enterocolitis due to Clostridium difficile, not specified as recurrent: Secondary | ICD-10-CM | POA: Insufficient documentation

## 2016-12-29 DIAGNOSIS — R21 Rash and other nonspecific skin eruption: Secondary | ICD-10-CM | POA: Diagnosis present

## 2016-12-29 DIAGNOSIS — F17200 Nicotine dependence, unspecified, uncomplicated: Secondary | ICD-10-CM | POA: Insufficient documentation

## 2016-12-29 DIAGNOSIS — F319 Bipolar disorder, unspecified: Secondary | ICD-10-CM | POA: Insufficient documentation

## 2016-12-29 DIAGNOSIS — F4310 Post-traumatic stress disorder, unspecified: Secondary | ICD-10-CM | POA: Insufficient documentation

## 2016-12-29 DIAGNOSIS — B1920 Unspecified viral hepatitis C without hepatic coma: Secondary | ICD-10-CM | POA: Insufficient documentation

## 2016-12-29 LAB — PROTIME-INR
INR: 1.25
PROTHROMBIN TIME: 15.6 s — AB (ref 11.4–15.2)

## 2016-12-29 LAB — LIPASE, BLOOD: LIPASE: 18 U/L (ref 11–51)

## 2016-12-29 LAB — URINALYSIS, COMPLETE (UACMP) WITH MICROSCOPIC
BILIRUBIN URINE: NEGATIVE
Glucose, UA: NEGATIVE mg/dL
Hgb urine dipstick: NEGATIVE
KETONES UR: NEGATIVE mg/dL
LEUKOCYTES UA: NEGATIVE
Nitrite: NEGATIVE
PROTEIN: 30 mg/dL — AB
Specific Gravity, Urine: 1.015 (ref 1.005–1.030)
pH: 6 (ref 5.0–8.0)

## 2016-12-29 LAB — CBC
HEMATOCRIT: 43.5 % (ref 40.0–52.0)
HEMOGLOBIN: 15.1 g/dL (ref 13.0–18.0)
MCH: 30.8 pg (ref 26.0–34.0)
MCHC: 34.6 g/dL (ref 32.0–36.0)
MCV: 88.9 fL (ref 80.0–100.0)
Platelets: 146 10*3/uL — ABNORMAL LOW (ref 150–440)
RBC: 4.9 MIL/uL (ref 4.40–5.90)
RDW: 13.4 % (ref 11.5–14.5)
WBC: 32.7 10*3/uL — AB (ref 3.8–10.6)

## 2016-12-29 LAB — BASIC METABOLIC PANEL
Anion gap: 10 (ref 5–15)
BUN: 29 mg/dL — ABNORMAL HIGH (ref 6–20)
CO2: 28 mmol/L (ref 22–32)
CREATININE: 1.4 mg/dL — AB (ref 0.61–1.24)
Calcium: 8.8 mg/dL — ABNORMAL LOW (ref 8.9–10.3)
Chloride: 98 mmol/L — ABNORMAL LOW (ref 101–111)
Glucose, Bld: 121 mg/dL — ABNORMAL HIGH (ref 65–99)
POTASSIUM: 3.5 mmol/L (ref 3.5–5.1)
SODIUM: 136 mmol/L (ref 135–145)

## 2016-12-29 LAB — LACTIC ACID, PLASMA
LACTIC ACID, VENOUS: 1.1 mmol/L (ref 0.5–1.9)
Lactic Acid, Venous: 2.3 mmol/L (ref 0.5–1.9)

## 2016-12-29 LAB — HEPATIC FUNCTION PANEL
ALBUMIN: 3.3 g/dL — AB (ref 3.5–5.0)
ALT: 115 U/L — ABNORMAL HIGH (ref 17–63)
AST: 81 U/L — AB (ref 15–41)
Alkaline Phosphatase: 174 U/L — ABNORMAL HIGH (ref 38–126)
Bilirubin, Direct: 0.6 mg/dL — ABNORMAL HIGH (ref 0.1–0.5)
Indirect Bilirubin: 2.1 mg/dL — ABNORMAL HIGH (ref 0.3–0.9)
TOTAL PROTEIN: 6.2 g/dL — AB (ref 6.5–8.1)
Total Bilirubin: 2.7 mg/dL — ABNORMAL HIGH (ref 0.3–1.2)

## 2016-12-29 LAB — APTT: APTT: 46 s — AB (ref 24–36)

## 2016-12-29 LAB — POCT RAPID STREP A: Streptococcus, Group A Screen (Direct): NEGATIVE

## 2016-12-29 LAB — INFLUENZA PANEL BY PCR (TYPE A & B)
INFLAPCR: NEGATIVE
Influenza B By PCR: NEGATIVE

## 2016-12-29 LAB — MONONUCLEOSIS SCREEN: Mono Screen: NEGATIVE

## 2016-12-29 MED ORDER — VANCOMYCIN HCL IN DEXTROSE 1-5 GM/200ML-% IV SOLN
1000.0000 mg | Freq: Once | INTRAVENOUS | Status: AC
  Administered 2016-12-29: 1000 mg via INTRAVENOUS
  Filled 2016-12-29: qty 200

## 2016-12-29 MED ORDER — IOPAMIDOL (ISOVUE-300) INJECTION 61%
100.0000 mL | Freq: Once | INTRAVENOUS | Status: AC | PRN
  Administered 2016-12-29: 100 mL via INTRAVENOUS
  Filled 2016-12-29: qty 100

## 2016-12-29 MED ORDER — DOXYCYCLINE HYCLATE 100 MG IV SOLR
100.0000 mg | Freq: Two times a day (BID) | INTRAVENOUS | Status: DC
  Administered 2016-12-29 – 2016-12-31 (×4): 100 mg via INTRAVENOUS
  Filled 2016-12-29 (×7): qty 100

## 2016-12-29 MED ORDER — MORPHINE SULFATE (PF) 4 MG/ML IV SOLN
4.0000 mg | Freq: Once | INTRAVENOUS | Status: AC
  Administered 2016-12-29: 4 mg via INTRAVENOUS
  Filled 2016-12-29: qty 1

## 2016-12-29 MED ORDER — ONDANSETRON HCL 4 MG/2ML IJ SOLN
4.0000 mg | Freq: Once | INTRAMUSCULAR | Status: AC
  Administered 2016-12-29: 4 mg via INTRAVENOUS
  Filled 2016-12-29: qty 2

## 2016-12-29 MED ORDER — VANCOMYCIN HCL IN DEXTROSE 1-5 GM/200ML-% IV SOLN
INTRAVENOUS | Status: AC
  Administered 2016-12-29: 1000 mg via INTRAVENOUS
  Filled 2016-12-29: qty 200

## 2016-12-29 MED ORDER — PIPERACILLIN-TAZOBACTAM 3.375 G IVPB 30 MIN
3.3750 g | Freq: Once | INTRAVENOUS | Status: AC
  Administered 2016-12-29: 3.375 g via INTRAVENOUS
  Filled 2016-12-29: qty 50

## 2016-12-29 MED ORDER — SODIUM CHLORIDE 0.9 % IV BOLUS (SEPSIS): 1000.0000 mL | Freq: Once | INTRAVENOUS | Status: DC

## 2016-12-29 MED ORDER — SODIUM CHLORIDE 0.9 % IV BOLUS (SEPSIS)
1000.0000 mL | Freq: Once | INTRAVENOUS | Status: AC
  Administered 2016-12-29: 1000 mL via INTRAVENOUS

## 2016-12-29 MED ORDER — VANCOMYCIN HCL 10 G IV SOLR
1250.0000 mg | Freq: Two times a day (BID) | INTRAVENOUS | Status: DC
  Administered 2016-12-30: 05:00:00 1250 mg via INTRAVENOUS
  Filled 2016-12-29 (×3): qty 1250

## 2016-12-29 MED ORDER — PIPERACILLIN-TAZOBACTAM 3.375 G IVPB 30 MIN
INTRAVENOUS | Status: AC
  Administered 2016-12-29: 3.375 g via INTRAVENOUS
  Filled 2016-12-29: qty 50

## 2016-12-29 MED ORDER — PIPERACILLIN-TAZOBACTAM 3.375 G IVPB
3.3750 g | Freq: Three times a day (TID) | INTRAVENOUS | Status: DC
  Filled 2016-12-29 (×2): qty 50

## 2016-12-29 MED ORDER — IOPAMIDOL (ISOVUE-300) INJECTION 61%
30.0000 mL | Freq: Once | INTRAVENOUS | Status: AC | PRN
  Administered 2016-12-29: 30 mL via ORAL
  Filled 2016-12-29: qty 30

## 2016-12-29 NOTE — H&P (Signed)
[facility]   PATIENT NAME: Jerry Herrera    MR#:  [id number]  DATE OF BIRTH:  01-16-1981  DATE OF ADMISSION:  12/29/2016  PRIMARY CARE PHYSICIAN: Patient, No Pcp Per   REQUESTING/REFERRING PHYSICIAN: Pershing Proud, MD  CHIEF COMPLAINT:   Chief Complaint  Patient presents with  . Fatigue    HISTORY OF PRESENT ILLNESS:  Juel Bellerose  is a 36 y.o. male who presents with Chills, malaise, fatigue, diarrhea. Patient states that he began feeling bad about 3 days ago. A couple of days prior to that he noticed a target-like rash on his inner thigh. [place] area, but does not specifically recall any tick bite. Here in the ED he was found to have tachycardia, leukocytosis. Specific etiology of his infection is not clear. UA does not indicate infection, chest x-ray does not indicate pneumonia. CT scan of his abdomen and pelvis does not show any clear sign of infection. Hospitals were called for admission and further evaluation and treatment  PAST MEDICAL HISTORY:   Past Medical History:  Diagnosis Date  . Anxiety   . Bipolar 1 disorder (HCC)   . Fatty liver   . Polysubstance abuse   . PTSD (post-traumatic stress disorder)     PAST SURGICAL HISTORY:   Past Surgical History:  Procedure Laterality Date  . NO PAST SURGERIES      SOCIAL HISTORY:   Social History  Substance Use Topics  . Smoking status: Current Every Day Smoker  . Smokeless tobacco: Never Used  . Alcohol use No    FAMILY HISTORY:  History reviewed. No pertinent family history.  DRUG ALLERGIES:  No Known Allergies  MEDICATIONS AT HOME:   Prior to Admission medications   Medication Sig Start Date End Date Taking? Authorizing Provider  ALPRAZolam Prudy Feeler) 1 MG tablet Take 1 mg by mouth 3 (three) times daily as needed.    [provider]  cephALEXin (KEFLEX) 500 MG capsule Take 500 mg by mouth 4 (four)  times daily.    [provider]  ciprofloxacin (CIPRO) 500 MG tablet Take 500 mg by mouth 2 (two) times daily.    [provider]  HYDROcodone-acetaminophen (NORCO/VICODIN) 5-325 MG tablet Take 1 tablet by mouth every 6 (six) hours as needed for moderate pain. 12/10/16   Little, Traci M, PA-C  ibuprofen (ADVIL,MOTRIN) 800 MG tablet Take 1 tablet (800 mg total) by mouth every 8 (eight) hours as needed for moderate pain. 11/25/14   Irean Hong, MD  OLANZapine (ZYPREXA) 5 MG tablet Take 5 mg by mouth 2 times daily at 12 noon and 4 pm.    [provider]  oxyCODONE-acetaminophen (ROXICET) 5-325 MG per tablet Take 1 tablet by mouth every 4 (four) hours as needed for severe pain. 11/25/14   Irean Hong, MD  sulfamethoxazole-trimethoprim (BACTRIM DS,SEPTRA DS) 800-160 MG per tablet Take 2 tablets by mouth 2 (two) times daily. 11/25/14   Irean Hong, MD    REVIEW OF SYSTEMS:  Review of Systems  Constitutional: Positive for chills and malaise/fatigue. Negative for fever and weight loss.  HENT: Negative for ear pain, hearing loss and tinnitus.   Eyes: Negative for blurred vision, double vision, pain and redness.  Respiratory: Negative for cough, hemoptysis and shortness of breath.   Cardiovascular: Negative for chest pain, palpitations, orthopnea and leg swelling.  Gastrointestinal: Positive for diarrhea. Negative for abdominal pain, constipation, nausea  and vomiting.  Genitourinary: Negative for dysuria, frequency and hematuria.  Musculoskeletal: Negative for back pain, joint pain and neck pain.  Skin: Positive for rash.  Neurological: Negative for dizziness, tremors, focal weakness and weakness.  Endo/Heme/Allergies: Negative for polydipsia. Does not bruise/bleed easily.  Psychiatric/Behavioral: Negative for depression. The patient is not nervous/anxious and does not have insomnia.      VITAL SIGNS:   Vitals:   12/29/16 2019 12/29/16 2030 12/29/16 2113 12/29/16 2130  BP:  109/63 (!) 103/55 (!) 113/52 (!) 103/41  Pulse: 86 85 88 82  Resp: Temp:      TempSrc:      SpO2: 100% 100% 100% 100%  Weight:      Height:       Wt Readings from Last 3 Encounters:  12/29/16 90.7 kg (200 lb)  12/10/16 90.7 kg (200 lb)  11/25/14 88.5 kg (195 lb)    PHYSICAL EXAMINATION:  Physical Exam  Vitals reviewed. Constitutional: He is oriented to person, place, and time. He appears well-developed and well-nourished. No distress.  HENT:  Head: Normocephalic and atraumatic.  Dry mucous membranes  Eyes: Pupils are equal, round, and reactive to light. Conjunctivae and EOM are normal. No scleral icterus.  Neck: Normal range of motion. Neck supple. No JVD present. No thyromegaly present.  Cardiovascular: Normal rate, regular rhythm and intact distal pulses.  Exam reveals no gallop and no friction rub.   No murmur heard. Respiratory: Effort normal and breath sounds normal. No respiratory distress. He has no wheezes. He has no rales.  GI: Soft. Bowel sounds are normal. He exhibits no distension. There is no tenderness.  Musculoskeletal: Normal range of motion. He exhibits no edema.  No arthritis, no gout  Lymphadenopathy:    He has no cervical adenopathy.  Neurological: He is alert and oriented to person, place, and time. No cranial nerve deficit.  No dysarthria, no aphasia  Skin: Skin is warm and dry. No rash noted. No erythema.  Psychiatric: He has a normal mood and affect. His behavior is normal. Judgment and thought content normal.    LABORATORY PANEL:   CBC  Recent Labs Lab 12/29/16 1657  WBC 32.7*  HGB 15.1  HCT 43.5  PLT 146*   ------------------------------------------------------------------------------------------------------------------  Chemistries   Recent Labs Lab 12/29/16 1657 12/29/16 1923  NA 136  --   K 3.5  --   CL 98*  --   CO2 28  --   GLUCOSE 121*  --   BUN 29*  --   CREATININE 1.40*  --   CALCIUM 8.8*  --   AST  --   81*  ALT  --  115*  ALKPHOS  --  174*  BILITOT  --  2.7*   ------------------------------------------------------------------------------------------------------------------  Cardiac Enzymes No results for input(s): TROPONINI in the last 168 hours. ------------------------------------------------------------------------------------------------------------------  RADIOLOGY:  Dg Chest 2 View  Result Date: 12/29/2016 CLINICAL DATA:  Chills and cough EXAM: CHEST  2 VIEW COMPARISON:  07/27/ 2016 FINDINGS: The heart size and mediastinal contours are within normal limits. Both lungs are clear. The visualized skeletal structures are unremarkable. IMPRESSION: No active cardiopulmonary disease. Electronically Signed   By: Jasmine Pang M.D.   On: 12/29/2016 19:39   Ct Abdomen Pelvis W Contrast  Result Date: 12/29/2016 CLINICAL DATA:  Fatigue and generalized weakness. EXAM: CT ABDOMEN AND PELVIS WITH CONTRAST TECHNIQUE: Multidetector CT imaging of the abdomen and pelvis was performed using the standard protocol following bolus administration  of intravenous contrast. CONTRAST:  ISOVUE-300 IOPAMIDOL (ISOVUE-300) INJECTION 61% COMPARISON:  09/18/2008 FINDINGS: Lower chest: Top-normal sized included heart. No pericardial effusion. Bibasilar dependent atelectasis. Hepatobiliary: No focal liver abnormality is seen. No gallstones, gallbladder wall thickening, or biliary dilatation. Pancreas: Unremarkable. No pancreatic ductal dilatation or surrounding inflammatory changes. Spleen: Normal in size without focal abnormality. Adrenals/Urinary Tract: Adrenal glands are unremarkable. Kidneys are normal, without renal calculi, focal lesion, or hydronephrosis. Bladder is unremarkable. Stomach/Bowel: Moderate distention of the stomach with enteric contrast and food. Normal small bowel rotation. No bowel obstruction or inflammation. A moderate amount retained fecal residue is seen from cecum to splenic flexure. The  appendix is not confidently identified however no pericecal inflammation or findings of acute appendicitis. Vascular/Lymphatic: No significant vascular findings are present. No enlarged abdominal or pelvic lymph nodes. Reproductive: Normal size prostate with central and peripheral zone calcifications. Seminal vesicles are unremarkable. Other: No abdominal wall hernia or abnormality. No abdominopelvic ascites. Musculoskeletal: No acute or significant osseous findings. IMPRESSION: 1. Increased colonic stool burden, query constipation. 2. No acute solid nor hollow visceral organ abnormality is otherwise noted. Electronically Signed   By: Tollie Eth M.D.   On: 12/29/2016 20:17    EKG:   Orders placed or performed during the hospital encounter of 12/29/16  . ED EKG  . ED EKG    IMPRESSION AND PLAN:  Principal Problem:   Sepsis (HCC) - unclear etiology, though there is some suspicion for possible tickborne illness. Patient had rash several days ago, now has mild transaminitis, mildly elevated bilirubin, mild thrombocytopenia, chills with likely fever, and white count. Broad antibiotics started, doxycycline added, aching on spotted fever and Lyme serology sent, other cultures sent from the ED. Lactic acid was mildly elevated, IV fluids and place and we will trend lactate into within normal limits. Patient is hemodynamically stable. Active Problems:   Target rash - workup and treatment as above   PTSD (post-traumatic stress disorder) - continue home meds  All the records are reviewed and case discussed with ED provider. Management plans discussed with the patient and/or family.  DVT PROPHYLAXIS: SubQ lovenox  GI PROPHYLAXIS: None  ADMISSION STATUS: Inpatient  CODE STATUS: Full Code Status History    This patient does not have a recorded code status. Please follow your organizational policy for patients in this situation.      TOTAL TIME TAKING CARE OF THIS PATIENT: 45 minutes.   Taeler Winning,  Patryce Depriest FIELDING 12/29/2016, 11:04 PM  [facility]  Office  [phone]  CC: Primary care physician; Patient, No Pcp Per  Note:  This document was prepared using Dragon voice recognition software and may include unintentional dictation errors.

## 2016-12-29 NOTE — ED Notes (Signed)
Pt ambulated to the bathroom and returned to his bed without difficulty.  

## 2016-12-29 NOTE — ED Provider Notes (Signed)
[facility] Emergency Department Provider Note  ____________________________________________   First MD Initiated Contact with Patient 12/29/16 1834     (approximate)  I have reviewed the triage vital signs and the nursing notes.   HISTORY  Chief Complaint Fatigue   HPI Jerry Herrera is a 36 y.o. male with a history of anxiety as well as bipolar disorder who is presenting to the emergency department today with weakness as well as diffuse body aches. He says the body aches or 9 out of 10. Says that he is also extrinsic intermittent abdominal pain as well as chills. Says that he had a target-like rash to his upper inner thigh 1 week ago but did not notice a tick. Denies any burning with urination. Says that he had dry heaving several days ago but since available to take fluids. Says that he is also experiencing a sore throat.also with dry cough.   Past Medical History:  Diagnosis Date  . Anxiety   . Bipolar 1 disorder (HCC)   . Fatty liver   . Polysubstance abuse   . PTSD (post-traumatic stress disorder)     There are no active problems to display for this patient.   History reviewed. No pertinent surgical history.  Prior to Admission medications   Medication Sig Start Date End Date Taking? Authorizing Provider  ALPRAZolam Prudy Feeler) 1 MG tablet Take 1 mg by mouth 3 (three) times daily as needed.    [provider]  cephALEXin (KEFLEX) 500 MG capsule Take 500 mg by mouth 4 (four) times daily.    [provider]  ciprofloxacin (CIPRO) 500 MG tablet Take 500 mg by mouth 2 (two) times daily.    [provider]  HYDROcodone-acetaminophen (NORCO/VICODIN) 5-325 MG tablet Take 1 tablet by mouth every 6 (six) hours as needed for moderate pain. 12/10/16   Little, Traci M, PA-C  ibuprofen (ADVIL,MOTRIN) 800 MG tablet Take 1 tablet (800 mg total) by mouth every 8 (eight) hours as needed for moderate pain. 11/25/14   Irean Hong, MD    OLANZapine (ZYPREXA) 5 MG tablet Take 5 mg by mouth 2 times daily at 12 noon and 4 pm.    [provider]  oxyCODONE-acetaminophen (ROXICET) 5-325 MG per tablet Take 1 tablet by mouth every 4 (four) hours as needed for severe pain. 11/25/14   Irean Hong, MD  sulfamethoxazole-trimethoprim (BACTRIM DS,SEPTRA DS) 800-160 MG per tablet Take 2 tablets by mouth 2 (two) times daily. 11/25/14   Irean Hong, MD    Allergies Patient has no known allergies.  History reviewed. No pertinent family history.  Social History Social History  Substance Use Topics  . Smoking status: Current Every Day Smoker  . Smokeless tobacco: Never Used  . Alcohol use No    Review of Systems  Constitutional: chills Eyes: No visual changes. ENT: No sore throat. Cardiovascular: Denies chest pain. Respiratory: as above Gastrointestinal:  No diarrhea.  No constipation. Genitourinary: Negative for dysuria. Musculoskeletal: diffuse body aches Skin: Negative for rash. Neurological: Negative for headaches, focal weakness or numbness.   ____________________________________________   PHYSICAL EXAM:  VITAL SIGNS: ED Triage Vitals [12/29/16 1658]  Enc Vitals Group     BP (!) 113/58     Pulse Rate (!) 119     Resp 20     Temp 98.8 F (37.1 C)     Temp Source Oral     SpO2 98 %     Weight 200 lb (90.7  kg)     Height  (1.803 m)     Head Circumference      Peak Flow      Pain Score 7     Pain Loc      Pain Edu?      Excl. in GC?     Constitutional: Alert and oriented. Well appearing and in no acute distress. Eyes: Conjunctivae are normal.  Head: Atraumatic. Nose: No congestion/rhinnorhea. Mouth/Throat: Mucous membranes are moist.  Neck: No stridor.   Cardiovascular: Normal rate, regular rhythm. Grossly normal heart sounds.   Respiratory: Normal respiratory effort.  No retractions. Lungs CTAB. Gastrointestinal: Soft with mild and diffuse abdominal tenderness palpation. No distention.   Musculoskeletal: No lower extremity tenderness nor edema.  No joint effusions. Neurologic:  Normal speech and language. No gross focal neurologic deficits are appreciated. Skin:  Skin is warm, dry and intact. No rash noted. Psychiatric: Mood and affect are normal. Speech and behavior are normal.  ____________________________________________   LABS (all labs ordered are listed, but only abnormal results are displayed)  Labs Reviewed  BASIC METABOLIC PANEL - Abnormal; Notable for the following:       Result Value   Chloride 98 (*)    Glucose, Bld 121 (*)    BUN 29 (*)    Creatinine, Ser 1.40 (*)    Calcium 8.8 (*)    All other components within normal limits  CBC - Abnormal; Notable for the following:    WBC 32.7 (*)    Platelets 146 (*)    All other components within normal limits  URINALYSIS, COMPLETE (UACMP) WITH MICROSCOPIC - Abnormal; Notable for the following:    Color, Urine AMBER (*)    APPearance HAZY (*)    Protein, ur 30 (*)    Bacteria, UA RARE (*)    Squamous Epithelial / LPF 0-5 (*)    All other components within normal limits  HEPATIC FUNCTION PANEL  LIPASE, BLOOD  B. BURGDORFI ANTIBODIES  ROCKY MTN SPOTTED FVR ABS PNL(IGG+IGM)  MONONUCLEOSIS SCREEN   ____________________________________________  EKG   ____________________________________________  RADIOLOGY  no acute finding on the chest or abdomen. ____________________________________________   PROCEDURES  Procedure(s) performed:   Procedures  Critical Care performed:   ____________________________________________   INITIAL IMPRESSION / ASSESSMENT AND PLAN / ED COURSE  Pertinent labs & imaging results that were available during my care of the patient were reviewed by me and considered in my medical decision making (see chart for details). DDX: Bacteremia, Lyme disease, viral syndrome, mono, strep throat, pneumonia patient with very elevated white blood cell count at 32.7. Also with  renal insufficiency. Will be given morphine, Zofran and fluids. Cultures will be drawn.     ----------------------------------------- 10:02 PM on 12/29/2016 -----------------------------------------  Patient with elevated lactic acid as well as indirect bilirubin. Reassuring imaging. However, the patient continues to feel ill and says that he is now noticing swelling in his feet. Sepsis alert was called. The patient will be admitted to the hospital with empiric antibiotic treatment, broad-spectrum coverage.he is understanding the plan and willing to comply. Signed out to Dr. Anne Hahn of the medicine service. ____________________________________________   FINAL CLINICAL IMPRESSION(S) / ED DIAGNOSES  sepsis. Hyperbilirubinemia.    NEW MEDICATIONS STARTED DURING THIS VISIT:  New Prescriptions   No medications on file     Note:  This document was prepared using Dragon voice recognition software and may include unintentional dictation errors.     Myrna Blazer, MD 12/29/16  2207  

## 2016-12-29 NOTE — ED Notes (Signed)
CODE SEPSIS CALLED TO KIM AT [facility] 

## 2016-12-29 NOTE — Progress Notes (Addendum)
Pharmacy Antibiotic Note  Jerry Herrera is a 36 y.o. male admitted on 12/29/2016 with sepsis.  Pharmacy has been consulted for vanc/zosyn/doxycycline dosing.  Plan: Patient received vanc 1g and zosyn 3.375g IV x 1 in ED  Will f/u w/ vanc 1.25g IV q12h w/ 6 hour stack dose. Will draw vanc trough 9/27 @ 1500 prior to 4th dose. Will continue zosyn 3.375g IV q8h extended infusion dosing.  Ke 0.0742 T1/2 9.3 ~ 12 hrs for convenience of administration Goal trough 15 - 20 mcg/mL  BCID results were called into [facility]--contacted me to inform me pt resulted w/ E. Coli enterobacteracea as a result will switch from Zosyn to meropenem 1g IV q8h.  Height:  (180.3 cm) Weight: 200 lb (90.7 kg) IBW/kg (Calculated) : 75.3  Temp (24hrs), Avg:99.1 F (37.3 C), Min:98.8 F (37.1 C), Max:99.4 F (37.4 C)   Recent Labs Lab 12/29/16 1657 12/29/16 1924 12/29/16 2123  WBC 32.7*  --   --   CREATININE 1.40*  --   --   LATICACIDVEN  --  2.3* 1.1    Estimated Creatinine Clearance: 84.1 mL/min (A) (by C-G formula based on SCr of 1.4 mg/dL (H)).    No Known Allergies  Thank you for allowing pharmacy to be a part of this patient's care.  Thomasene Ripple, PharmD, BCPS Clinical Pharmacist 12/29/2016

## 2016-12-29 NOTE — ED Notes (Signed)
Patient transported to CT 

## 2016-12-29 NOTE — ED Notes (Signed)
Date and time results received: 12/29/16 8:28 PM (use smartphrase ".now" to insert current time)  Test: Lactic acid Critical Value: 2.3  Name of Provider Notified: Dr. Pershing Proud  Orders Received? Or Actions Taken?: No new orders at this time.

## 2016-12-29 NOTE — ED Triage Notes (Signed)
Pt here for multiple symptoms.  Pt c/o headache, joint pain, chills but has not checked temperature, nausea.  Has had  fatigue and  Generalized weakness.  When asked pt if bit by tick he was unsure and reports [place] area.  Pt then remembered a bulls eye rash to right leg a week ago.  Appears to feel bad. Tachy in triage. respiration unlabored.

## 2016-12-30 LAB — GASTROINTESTINAL PANEL BY PCR, STOOL (REPLACES STOOL CULTURE)
ADENOVIRUS F40/41: NOT DETECTED
ASTROVIRUS: NOT DETECTED
CAMPYLOBACTER SPECIES: NOT DETECTED
Cryptosporidium: NOT DETECTED
Cyclospora cayetanensis: NOT DETECTED
ENTEROAGGREGATIVE E COLI (EAEC): NOT DETECTED
ENTEROPATHOGENIC E COLI (EPEC): DETECTED — AB
ENTEROTOXIGENIC E COLI (ETEC): NOT DETECTED
Entamoeba histolytica: NOT DETECTED
GIARDIA LAMBLIA: NOT DETECTED
Norovirus GI/GII: NOT DETECTED
PLESIMONAS SHIGELLOIDES: NOT DETECTED
Rotavirus A: NOT DETECTED
Salmonella species: NOT DETECTED
Sapovirus (I, II, IV, and V): NOT DETECTED
Shiga like toxin producing E coli (STEC): NOT DETECTED
Shigella/Enteroinvasive E coli (EIEC): NOT DETECTED
VIBRIO CHOLERAE: NOT DETECTED
Vibrio species: NOT DETECTED
Yersinia enterocolitica: NOT DETECTED

## 2016-12-30 LAB — DIFFERENTIAL
Basophils Absolute: 0.1 10*3/uL (ref 0–0.1)
Basophils Relative: 0 %
EOS ABS: 0.1 10*3/uL (ref 0–0.7)
EOS PCT: 0 %
LYMPHS PCT: 8 %
Lymphs Abs: 1.8 10*3/uL (ref 1.0–3.6)
MONO ABS: 1.2 10*3/uL — AB (ref 0.2–1.0)
Monocytes Relative: 5 %
NEUTROS PCT: 87 %
Neutro Abs: 19.2 10*3/uL — ABNORMAL HIGH (ref 1.4–6.5)

## 2016-12-30 LAB — HEPATIC FUNCTION PANEL
ALK PHOS: 173 U/L — AB (ref 38–126)
ALT: 93 U/L — AB (ref 17–63)
AST: 61 U/L — ABNORMAL HIGH (ref 15–41)
Albumin: 2.8 g/dL — ABNORMAL LOW (ref 3.5–5.0)
BILIRUBIN INDIRECT: 1.2 mg/dL — AB (ref 0.3–0.9)
BILIRUBIN TOTAL: 1.8 mg/dL — AB (ref 0.3–1.2)
Bilirubin, Direct: 0.6 mg/dL — ABNORMAL HIGH (ref 0.1–0.5)
Total Protein: 5.4 g/dL — ABNORMAL LOW (ref 6.5–8.1)

## 2016-12-30 LAB — RAPID HIV SCREEN (HIV 1/2 AB+AG)
HIV 1/2 Antibodies: NONREACTIVE
HIV-1 P24 ANTIGEN - HIV24: NONREACTIVE

## 2016-12-30 LAB — CBC
HCT: 40.7 % (ref 40.0–52.0)
Hemoglobin: 14 g/dL (ref 13.0–18.0)
MCH: 30.7 pg (ref 26.0–34.0)
MCHC: 34.4 g/dL (ref 32.0–36.0)
MCV: 89.3 fL (ref 80.0–100.0)
PLATELETS: 140 10*3/uL — AB (ref 150–440)
RBC: 4.55 MIL/uL (ref 4.40–5.90)
RDW: 13.6 % (ref 11.5–14.5)
WBC: 29.3 10*3/uL — ABNORMAL HIGH (ref 3.8–10.6)

## 2016-12-30 LAB — BASIC METABOLIC PANEL
ANION GAP: 7 (ref 5–15)
BUN: 22 mg/dL — AB (ref 6–20)
CALCIUM: 8.3 mg/dL — AB (ref 8.9–10.3)
CO2: 28 mmol/L (ref 22–32)
CREATININE: 1.01 mg/dL (ref 0.61–1.24)
Chloride: 102 mmol/L (ref 101–111)
GFR calc Af Amer: >60 mL/min (ref >60)
GLUCOSE: 98 mg/dL (ref 65–99)
Potassium: 3.3 mmol/L — ABNORMAL LOW (ref 3.5–5.1)
Sodium: 137 mmol/L (ref 135–145)

## 2016-12-30 LAB — C DIFFICILE QUICK SCREEN W PCR REFLEX
C DIFFICLE (CDIFF) ANTIGEN: POSITIVE — AB
C Diff toxin: NEGATIVE

## 2016-12-30 LAB — CLOSTRIDIUM DIFFICILE BY PCR: CDIFFPCR: POSITIVE — AB

## 2016-12-30 MED ORDER — INFLUENZA VAC SPLIT QUAD 0.5 ML IM SUSY
0.5000 mL | PREFILLED_SYRINGE | INTRAMUSCULAR | Status: AC
  Administered 2016-12-31: 11:00:00 0.5 mL via INTRAMUSCULAR
  Filled 2016-12-30: qty 0.5

## 2016-12-30 MED ORDER — PREMIER PROTEIN SHAKE
11.0000 [oz_av] | ORAL | Status: DC
  Administered 2016-12-30: 14:00:00 11 [oz_av] via ORAL

## 2016-12-30 MED ORDER — VANCOMYCIN 50 MG/ML ORAL SOLUTION
125.0000 mg | Freq: Four times a day (QID) | ORAL | Status: DC
  Administered 2016-12-30 – 2016-12-31 (×5): 125 mg via ORAL
  Filled 2016-12-30 (×6): qty 2.5

## 2016-12-30 MED ORDER — POTASSIUM CHLORIDE CRYS ER 20 MEQ PO TBCR
40.0000 meq | EXTENDED_RELEASE_TABLET | Freq: Once | ORAL | Status: AC
  Administered 2016-12-30: 13:00:00 40 meq via ORAL
  Filled 2016-12-30: qty 2

## 2016-12-30 MED ORDER — HYDROCODONE-ACETAMINOPHEN 5-325 MG PO TABS
1.0000 | ORAL_TABLET | Freq: Four times a day (QID) | ORAL | Status: DC | PRN
  Administered 2016-12-30 – 2016-12-31 (×5): 1 via ORAL
  Filled 2016-12-30 (×5): qty 1

## 2016-12-30 MED ORDER — ONDANSETRON HCL 4 MG PO TABS
4.0000 mg | ORAL_TABLET | Freq: Four times a day (QID) | ORAL | Status: DC | PRN
  Administered 2016-12-31: 4 mg via ORAL
  Filled 2016-12-30: qty 1

## 2016-12-30 MED ORDER — ACETAMINOPHEN 650 MG RE SUPP: 650.0000 mg | Freq: Four times a day (QID) | RECTAL | Status: DC | PRN

## 2016-12-30 MED ORDER — SODIUM CHLORIDE 0.9 % IV SOLN
1.0000 g | Freq: Three times a day (TID) | INTRAVENOUS | Status: DC
  Administered 2016-12-30: 03:00:00 1 g via INTRAVENOUS
  Filled 2016-12-30 (×3): qty 1

## 2016-12-30 MED ORDER — ONDANSETRON HCL 4 MG/2ML IJ SOLN: 4.0000 mg | Freq: Four times a day (QID) | INTRAMUSCULAR | Status: DC | PRN

## 2016-12-30 MED ORDER — ACETAMINOPHEN 325 MG PO TABS: 650.0000 mg | ORAL_TABLET | Freq: Four times a day (QID) | ORAL | Status: DC | PRN

## 2016-12-30 MED ORDER — SODIUM CHLORIDE 0.9 % IV SOLN
INTRAVENOUS | Status: AC
  Administered 2016-12-30: 02:00:00 via INTRAVENOUS

## 2016-12-30 MED ORDER — GUAIFENESIN-DM 100-10 MG/5ML PO SYRP
5.0000 mL | ORAL_SOLUTION | ORAL | Status: DC | PRN
  Filled 2016-12-30: qty 5

## 2016-12-30 MED ORDER — OXYCODONE HCL 5 MG PO TABS
5.0000 mg | ORAL_TABLET | ORAL | Status: DC | PRN
  Administered 2016-12-30: 5 mg via ORAL
  Filled 2016-12-30: qty 1

## 2016-12-30 MED ORDER — PNEUMOCOCCAL VAC POLYVALENT 25 MCG/0.5ML IJ INJ: 0.5000 mL | INJECTION | INTRAMUSCULAR | Status: DC

## 2016-12-30 MED ORDER — ENOXAPARIN SODIUM 40 MG/0.4ML ~~LOC~~ SOLN
40.0000 mg | SUBCUTANEOUS | Status: DC
  Administered 2016-12-30: 02:00:00 40 mg via SUBCUTANEOUS
  Filled 2016-12-30 (×2): qty 0.4

## 2016-12-30 MED ORDER — ORAL CARE MOUTH RINSE: 15.0000 mL | Freq: Two times a day (BID) | OROMUCOSAL | Status: DC

## 2016-12-30 MED ORDER — OLANZAPINE 5 MG PO TABS
5.0000 mg | ORAL_TABLET | Freq: Two times a day (BID) | ORAL | Status: DC
  Filled 2016-12-30 (×5): qty 1

## 2016-12-30 MED ORDER — OXYCODONE HCL 5 MG PO TABS: 5.0000 mg | ORAL_TABLET | ORAL | Status: DC | PRN

## 2016-12-30 MED ORDER — ALPRAZOLAM 1 MG PO TABS: 1.0000 mg | ORAL_TABLET | Freq: Three times a day (TID) | ORAL | Status: DC | PRN

## 2016-12-30 MED ORDER — OLANZAPINE 5 MG PO TABS
5.0000 mg | ORAL_TABLET | Freq: Two times a day (BID) | ORAL | Status: DC
  Filled 2016-12-30: qty 1

## 2016-12-30 NOTE — Plan of Care (Signed)
Problem: Education: Goal: Knowledge of [facility] Education information/materials will improve Outcome: Progressing Pt likes to be called Jerry Herrera  Past Medical History:  Diagnosis Date  . Anxiety   . Bipolar 1 disorder (HCC)   . Fatty liver   . Polysubstance abuse   . PTSD (post-traumatic stress disorder)    Pt is well controlled with home medications

## 2016-12-30 NOTE — Progress Notes (Signed)
[employer] [facility] [place] [facility]                                                                                                                                                                                  Patient Demographics   Jerry Herrera, is a 36 y.o. male, DOB - 08-18-1980, ZOX:[id number]  Admit date - 12/29/2016   Admitting Physician Oralia Manis, MD  Outpatient Primary MD for the patient is Patient, No Pcp Per   LOS - 1  Subjective: Patient admitted with diarrhea joint pains. Diarrhea improved.  No fever  Positive joint pain   Review of Systems:   CONSTITUTIONAL: No documented fever. No fatigue, weakness. No weight gain, no weight loss.  EYES: No blurry or double vision.  ENT: No tinnitus. No postnasal drip. No redness of the oropharynx.  RESPIRATORY: No cough, no wheeze, no hemoptysis. No dyspnea.  CARDIOVASCULAR: No chest pain. No orthopnea. No palpitations. No syncope.  GASTROINTESTINAL: No nausea, no vomiting or diarrhea. No abdominal pain. No melena or hematochezia.  GENITOURINARY: No dysuria or hematuria.  ENDOCRINE: No polyuria or nocturia. No heat or cold intolerance.  HEMATOLOGY: No anemia. No bruising. No bleeding.  INTEGUMENTARY: No rashes. No lesions.  MUSCULOSKELETAL: No arthritis. No swelling. No gout. + joint pain NEUROLOGIC: No numbness, tingling, or ataxia. No seizure-type activity.  PSYCHIATRIC: No anxiety. No insomnia. No ADD.    Vitals:   Vitals:   12/30/16 0039 12/30/16 0423 12/30/16 0503 12/30/16 0756  BP: (!) 106/54 (!) 93/39 (!) 97/49 (!) 99/47  Pulse: 76 75 73 68  Resp: 20 20  18   Temp: 97.9 F (36.6 C) 97.8 F (36.6 C)  97.8 F (36.6 C)  TempSrc:  Oral  Oral  SpO2: 95% 98% 100% 99%  Weight: 187 lb (84.8 kg)     Height: 5\' 11"  (1.803 m)       Wt Readings from Last 3 Encounters:  12/30/16 187 lb (84.8 kg)  12/10/16 200 lb (90.7 kg)  11/25/14 195 lb (88.5 kg)     Intake/Output Summary (Last 24 hours)  at 12/30/16 1151 Last data filed at 12/30/16 0527  Gross per 24 hour  Intake             2020 ml  Output              350 ml  Net             1670 ml    Physical Exam:   GENERAL: Pleasant-appearing in no apparent distress.  HEAD, EYES, EARS, NOSE AND THROAT: Atraumatic, normocephalic. Extraocular muscles are intact. Pupils equal and reactive to light. Sclerae anicteric. No conjunctival  injection. No oro-pharyngeal erythema.  NECK: Supple. There is no jugular venous distention. No bruits, no lymphadenopathy, no thyromegaly.  HEART: Regular rate and rhythm,. No murmurs, no rubs, no clicks.  LUNGS: Clear to auscultation bilaterally. No rales or rhonchi. No wheezes.  ABDOMEN: Soft, flat, nontender, nondistended. Has good bowel sounds. No hepatosplenomegaly appreciated.  EXTREMITIES: No evidence of any cyanosis, clubbing, or peripheral edema.  +2 pedal and radial pulses bilaterally.  NEUROLOGIC: The patient is alert, awake, and oriented x3 with no focal motor or sensory deficits appreciated bilaterally.  SKIN: Moist and warm with no rashes appreciated.  Psych: Not anxious, depressed LN: No inguinal LN enlargement    Antibiotics   Anti-infectives    Start     Dose/Rate Route Frequency Ordered Stop   12/30/16 1200  vancomycin (VANCOCIN) 50 mg/mL oral solution 125 mg     125 mg Oral Every 6 hours 12/30/16 1049     12/30/16 0600  piperacillin-tazobactam (ZOSYN) IVPB 3.375 g  Status:  Discontinued     3.375 g 12.5 mL/hr over 240 Minutes Intravenous Every 8 hours 12/29/16 2255 12/30/16 0302   12/30/16 0400  vancomycin (VANCOCIN) 1,250 mg in sodium chloride 0.9 % 250 mL IVPB  Status:  Discontinued     1,250 mg 166.7 mL/hr over 90 Minutes Intravenous Every 12 hours 12/29/16 2255 12/30/16 1049   12/30/16 0315  meropenem (MERREM) 1 g in sodium chloride 0.9 % 100 mL IVPB  Status:  Discontinued     1 g 200 mL/hr over 30 Minutes Intravenous Every 8 hours 12/30/16 0302 12/30/16 1049   12/29/16  2215  doxycycline (VIBRAMYCIN) 100 mg in dextrose 5 % 250 mL IVPB     100 mg 125 mL/hr over 120 Minutes Intravenous Every 12 hours 12/29/16 2207     12/29/16 2200  piperacillin-tazobactam (ZOSYN) IVPB 3.375 g     3.375 g 100 mL/hr over 30 Minutes Intravenous  Once 12/29/16 2159 12/29/16 2251   12/29/16 2200  vancomycin (VANCOCIN) IVPB 1000 mg/200 mL premix     1,000 mg 200 mL/hr over 60 Minutes Intravenous  Once 12/29/16 2159 12/29/16 2346      Medications   Scheduled Meds: . enoxaparin (LOVENOX) injection  40 mg Subcutaneous Q24H  . [START ON 12/31/2016] Influenza vac split quadrivalent PF  0.5 mL Intramuscular Tomorrow-1000  . mouth rinse  15 mL Mouth Rinse q12n4p  . OLANZapine  5 mg Oral BID  . [START ON 12/31/2016] pneumococcal 23 valent vaccine  0.5 mL Intramuscular Tomorrow-1000  . potassium chloride  40 mEq Oral Once  . vancomycin  125 mg Oral Q6H   Continuous Infusions: . sodium chloride 100 mL/hr at 12/30/16 0145  . doxycycline (VIBRAMYCIN) IV 100 mg (12/30/16 1050)   PRN Meds:.acetaminophen **OR** acetaminophen, ALPRAZolam, HYDROcodone-acetaminophen, ondansetron **OR** ondansetron (ZOFRAN) IV, oxyCODONE   Data Review:   Micro Results Recent Results (from the past 240 hour(s))  Blood culture (routine x 2)     Status: None (Preliminary result)   Collection Time: 12/29/16  7:24 PM  Result Value Ref Range Status   Specimen Description BLOOD LEFT ANTECUBITAL  Final   Special Requests   Final    BOTTLES DRAWN AEROBIC AND ANAEROBIC Blood Culture adequate volume   Culture NO GROWTH < 12 HOURS  Final   Report Status PENDING  Incomplete  Blood culture (routine x 2)     Status: None (Preliminary result)   Collection Time: 12/29/16  7:24 PM  Result Value Ref Range Status  Specimen Description BLOOD BLOOD LEFT HAND  Final   Special Requests   Final    BOTTLES DRAWN AEROBIC AND ANAEROBIC Blood Culture adequate volume   Culture NO GROWTH < 12 HOURS  Final   Report Status  PENDING  Incomplete  C difficile quick scan w PCR reflex     Status: Abnormal   Collection Time: 12/29/16 11:32 PM  Result Value Ref Range Status   C Diff antigen POSITIVE (A) NEGATIVE Final   C Diff toxin NEGATIVE NEGATIVE Final   C Diff interpretation Results are indeterminate. See PCR results.  Final    Comment: VALID  Gastrointestinal Panel by PCR , Stool     Status: Abnormal   Collection Time: 12/29/16 11:32 PM  Result Value Ref Range Status   Campylobacter species NOT DETECTED NOT DETECTED Final   Plesimonas shigelloides NOT DETECTED NOT DETECTED Final   Salmonella species NOT DETECTED NOT DETECTED Final   Yersinia enterocolitica NOT DETECTED NOT DETECTED Final   Vibrio species NOT DETECTED NOT DETECTED Final   Vibrio cholerae NOT DETECTED NOT DETECTED Final   Enteroaggregative E coli (EAEC) NOT DETECTED NOT DETECTED Final   Enteropathogenic E coli (EPEC) DETECTED (A) NOT DETECTED Final    Comment: RESULT CALLED TO, READ BACK BY AND VERIFIED WITH: SYLVIA FUENTES AT 0237 12/30/16 ALV    Enterotoxigenic E coli (ETEC) NOT DETECTED NOT DETECTED Final   Shiga like toxin producing E coli (STEC) NOT DETECTED NOT DETECTED Final   Shigella/Enteroinvasive E coli (EIEC) NOT DETECTED NOT DETECTED Final   Cryptosporidium NOT DETECTED NOT DETECTED Final   Cyclospora cayetanensis NOT DETECTED NOT DETECTED Final   Entamoeba histolytica NOT DETECTED NOT DETECTED Final   Giardia lamblia NOT DETECTED NOT DETECTED Final   Adenovirus F40/41 NOT DETECTED NOT DETECTED Final   Astrovirus NOT DETECTED NOT DETECTED Final   Norovirus GI/GII NOT DETECTED NOT DETECTED Final   Rotavirus A NOT DETECTED NOT DETECTED Final   Sapovirus (I, II, IV, and V) NOT DETECTED NOT DETECTED Final  Clostridium Difficile by PCR     Status: Abnormal   Collection Time: 12/29/16 11:32 PM  Result Value Ref Range Status   Toxigenic C Difficile by pcr POSITIVE (A) NEGATIVE Final    Comment: Positive for toxigenic C. difficile  with little to no toxin production. Only treat if clinical presentation suggests symptomatic illness.    Radiology Reports Dg Chest 2 View  Result Date: 12/29/2016 CLINICAL DATA:  Chills and cough EXAM: CHEST  2 VIEW COMPARISON:  07/27/ 2016 FINDINGS: The heart size and mediastinal contours are within normal limits. Both lungs are clear. The visualized skeletal structures are unremarkable. IMPRESSION: No active cardiopulmonary disease. Electronically Signed   By: Jasmine Pang M.D.   On: 12/29/2016 19:39   Ct Abdomen Pelvis W Contrast  Result Date: 12/29/2016 CLINICAL DATA:  Fatigue and generalized weakness. EXAM: CT ABDOMEN AND PELVIS WITH CONTRAST TECHNIQUE: Multidetector CT imaging of the abdomen and pelvis was performed using the standard protocol following bolus administration of intravenous contrast. CONTRAST:  ISOVUE-300 IOPAMIDOL (ISOVUE-300) INJECTION 61% COMPARISON:  09/18/2008 FINDINGS: Lower chest: Top-normal sized included heart. No pericardial effusion. Bibasilar dependent atelectasis. Hepatobiliary: No focal liver abnormality is seen. No gallstones, gallbladder wall thickening, or biliary dilatation. Pancreas: Unremarkable. No pancreatic ductal dilatation or surrounding inflammatory changes. Spleen: Normal in size without focal abnormality. Adrenals/Urinary Tract: Adrenal glands are unremarkable. Kidneys are normal, without renal calculi, focal lesion, or hydronephrosis. Bladder is unremarkable. Stomach/Bowel: Moderate distention of  the stomach with enteric contrast and food. Normal small bowel rotation. No bowel obstruction or inflammation. A moderate amount retained fecal residue is seen from cecum to splenic flexure. The appendix is not confidently identified however no pericecal inflammation or findings of acute appendicitis. Vascular/Lymphatic: No significant vascular findings are present. No enlarged abdominal or pelvic lymph nodes. Reproductive: Normal size prostate with central  and peripheral zone calcifications. Seminal vesicles are unremarkable. Other: No abdominal wall hernia or abnormality. No abdominopelvic ascites. Musculoskeletal: No acute or significant osseous findings. IMPRESSION: 1. Increased colonic stool burden, query constipation. 2. No acute solid nor hollow visceral organ abnormality is otherwise noted. Electronically Signed   By: Tollie Eth M.D.   On: 12/29/2016 20:17     CBC  Recent Labs Lab 12/29/16 1657 12/30/16 0423  WBC 32.7* 29.3*  HGB 15.1 14.0  HCT 43.5 40.7  PLT 146* 140*  MCV 88.9 89.3  MCH 30.8 30.7  MCHC 34.6 34.4  RDW 13.4 13.6    Chemistries   Recent Labs Lab 12/29/16 1657 12/29/16 1923 12/30/16 0423  NA 136  --  137  K 3.5  --  3.3*  CL 98*  --  102  CO2 28  --  28  GLUCOSE 121*  --  98  BUN 29*  --  22*  CREATININE 1.40*  --  1.01  CALCIUM 8.8*  --  8.3*  AST  --  81*  --   ALT  --  115*  --   ALKPHOS  --  174*  --   BILITOT  --  2.7*  --    ------------------------------------------------------------------------------------------------------------------ estimated creatinine clearance is 107.7 mL/min (by C-G formula based on SCr of 1.01 mg/dL). ------------------------------------------------------------------------------------------------------------------ No results for input(s): HGBA1C in the last 72 hours. ------------------------------------------------------------------------------------------------------------------ No results for input(s): CHOL, HDL, LDLCALC, TRIG, CHOLHDL, LDLDIRECT in the last 72 hours. ------------------------------------------------------------------------------------------------------------------ No results for input(s): TSH, T4TOTAL, T3FREE, THYROIDAB in the last 72 hours.  Invalid input(s): FREET3 ------------------------------------------------------------------------------------------------------------------ No results for input(s): VITAMINB12, FOLATE, FERRITIN, TIBC, IRON,  RETICCTPCT in the last 72 hours.  Coagulation profile  Recent Labs Lab 12/29/16 2252  INR 1.25    No results for input(s): DDIMER in the last 72 hours.  Cardiac Enzymes No results for input(s): CKMB, TROPONINI, MYOGLOBIN in the last 168 hours.  Invalid input(s): CK ------------------------------------------------------------------------------------------------------------------ Invalid input(s): POCBNP    Assessment & Plan  Pt is 36 y/o with diarrhea  1. Sepsis (HCC) -  Due to c.diff colitis- start patient on oral vancomycin 2. C. Diff colitis- patient started on oral vancomycin 3. Tick exposure continue doxycycline for nowI will ask ID to assist with his presentation 4. Hypokalemia replace potassium 5.  PTSD continue home medications as taking            Code Status Orders        Start     Ordered   12/30/16 0113  Full code  Continuous     12/30/16 0112    Code Status History    Date Active Date Inactive Code Status Order ID Comments User Context   This patient has a current code status but no historical code status.           Consults id  DVT Prophylaxis  Lovenox    Lab Results  Component Value Date   PLT 140 (L) 12/30/2016     Time Spent in minutes   Greater than 50% of time spent in care coordination and counseling patient regarding  the condition and plan of care.   Auburn Bilberry M.D on 12/30/2016 at 11:51 AM  Between 7am to 6pm - Pager - [phone]  After 6pm go to [email or web address] - password [email or web address] [facility]  [facility] [address] [facility]   Office  [phone]

## 2016-12-30 NOTE — Care Management (Signed)
patient admitted with sepsis of unknown etiology.  White blood cells 32.7 on admission and receiving IV antibiotics.  Patient without payor. History of PTSD, bipolar disorder and polysubstance abuse.  Work up for tick borne illness is in progress

## 2016-12-30 NOTE — Progress Notes (Signed)
Initial Nutrition Assessment  DOCUMENTATION CODES:   Not applicable  INTERVENTION:  Provide Premier Protein po once daily, each supplement provides 160 kcal and 30 grams of protein.  Encouraged adequate intake of calories and protein to prevent further unintentional weight loss.  NUTRITION DIAGNOSIS:   Inadequate oral intake related to poor appetite, other (see comment) (diarrhea) as evidenced by per patient/family report.  GOAL:   Patient will meet greater than or equal to 90% of their needs  MONITOR:   PO intake, Supplement acceptance, Labs, Weight trends, I & O's  REASON FOR ASSESSMENT:   Malnutrition Screening Tool    ASSESSMENT:   36 year old male with PMHx of anxiety, bipolar 1 disorder, PTSD, polysubstance abuse, fatty liver who presented with chills, malaise, fatigue, diarrhea, and joint pain found to have C. Difficile colitis. Undergoing work-up for tick exposure.   Met with patient at bedside. He reports he has not been eating well the past week. 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 He tries to eat well and drink plenty of water, but reports it is difficult to keep up with. Patient reports he is feeling dehydrated now and his urine has been darker. On IV fluids and PO diet drinking well now. Patient reports he has been eating 5 meals per day lately. For breakfast has eggs and toast. Then will have a biscuit mid-morning with his boss. Did not describe how his other meals are. Usually drinks Gatorade mixed with water during the day. He would like a protein shake to help meet his protein needs and regain the muscle he feels he has lost. Patient endorses diarrhea and nausea this past week. Denies any other symptoms. Patient ate everything on his breakfast tray today except for his oatmeal.  Patient reports his UBW is 200 lbs. RD obtained bed scale weight of 188.8 lbs. Per his report, he has lost 11.2 lbs (5.6% body weight) over the past week, which is  significant for time frame. May be related partially to dehydration.  Medications reviewed and include: potassium chloride 40 mEq once PO, vancomycin, doxycycline, NS @ 100 ml/hr.  Labs reviewed: Potassium 3.3, BUN 22.  Nutrition-Focused physical exam completed. Findings are no fat depletion, no muscle depletion, and no edema. Patient feels as though he has lost muscle mass.  Patient does not meet criteria for malnutrition at this time.  Discussed with RN.  Diet Order:  Diet regular Room service appropriate? Yes; Fluid consistency: Thin  Skin:  Wound (see comment) (closed incision left wrist)  Last BM:  12/29/2016  Height:   Ht Readings from Last 1 Encounters:  12/30/16 '5\' 11"'$  (1.803 m)    Weight:   Wt Readings from Last 1 Encounters:  12/30/16 188 lb 12.8 oz (85.6 kg)    Ideal Body Weight:  78.2 kg  BMI:  Body mass index is 26.33 kg/m.  Estimated Nutritional Needs:   Kcal:  2160-2340 (MSJ x 1.2-1.3)  Protein:  85-100 grams (1-1.2 grams/kg)  Fluid:  2.5-3 L/day (30-35 ml/kg)  EDUCATION NEEDS:   No education needs identified at this time  Willey Blade, MS, RD, LDN Pager: [phone] After Hours Pager: [phone]

## 2016-12-30 NOTE — Consult Note (Signed)
[facility]ious Disease     Reason for Consult: Fever, tick bite, C diff   Referring Physician: Serita Grit Date of Admission:  12/29/2016   Principal Problem:   Sepsis ([place]) Active Problems:   Target rash   PTSD (post-traumatic stress disorder)   HPI: Jerry Herrera is a 36 y.o. male admitted with chills, malaise and diarrhea. On admit wbc was 32, temp 99.4.  He had mild elevation ast alt and T bili. plts were 140. Flu PCR neg, GAS screen neg UA neg, C diff +.  CT abd neg and CXR neg.  Since admit wbc down to 29 and no fevers. He has hx BPD, fatty liver, PTSD. Substance abuse.  Past Medical History:  Diagnosis Date  . Anxiety   . Bipolar 1 disorder ([place])   . Fatty liver   . Polysubstance abuse   . PTSD (post-traumatic stress disorder)    Past Surgical History:  Procedure Laterality Date  . NO PAST SURGERIES     Social History  Substance Use Topics  . Smoking status: Former Research scientist (life sciences)  . Smokeless tobacco: Never Used  . Alcohol use 7.2 oz/week    12 Cans of beer per week     Comment: 3 times at week   History reviewed. No pertinent family history.  Allergies: No Known Allergies  Current antibiotics: Antibiotics Given (last 72 hours)    Date/Time Action Medication Dose Rate   12/29/16 2209 New Bag/Given   piperacillin-tazobactam (ZOSYN) IVPB 3.375 g 3.375 g 100 mL/hr   12/29/16 2215 New Bag/Given   vancomycin (VANCOCIN) IVPB 1000 mg/200 mL premix 1,000 mg 200 mL/hr   12/29/16 2304 New Bag/Given   doxycycline (VIBRAMYCIN) 100 mg in dextrose 5 % 250 mL IVPB 100 mg 125 mL/hr   12/30/16 0322 New Bag/Given   meropenem (MERREM) 1 g in sodium chloride 0.9 % 100 mL IVPB 1 g 200 mL/hr   12/30/16 0517 New Bag/Given  [medication not available on time]   vancomycin (VANCOCIN) 1,250 mg in sodium chloride 0.9 % 250 mL IVPB 1,250 mg 166.7 mL/hr   12/30/16 1050 New Bag/Given   doxycycline (VIBRAMYCIN) 100 mg in dextrose 5 % 250 mL IVPB 100 mg 125 mL/hr   12/30/16 1305  Given   vancomycin (VANCOCIN) 50 mg/mL oral solution 125 mg 125 mg       MEDICATIONS: . enoxaparin (LOVENOX) injection  40 mg Subcutaneous Q24H  . [START ON 12/31/2016] Influenza vac split quadrivalent PF  0.5 mL Intramuscular Tomorrow-1000  . mouth rinse  15 mL Mouth Rinse q12n4p  . OLANZapine  5 mg Oral BID  . [START ON 12/31/2016] pneumococcal 23 valent vaccine  0.5 mL Intramuscular Tomorrow-1000  . protein supplement shake  11 oz Oral Q24H  . vancomycin  125 mg Oral Q6H    Review of Systems - 11 systems reviewed and negative per HPI   OBJECTIVE: Temp:  [97.7 F (36.5 C)-99.4 F (37.4 C)] 97.7 F (36.5 C) (09/26 1255) Pulse Rate:  [67-119] 67 (09/26 1300) Resp:  [14-20] 18 (09/26 0756) BP: (93-123)/(39-66) 100/56 (09/26 1300) SpO2:  [95 %-100 %] 100 % (09/26 1255) Weight:  [84.8 kg (187 lb)-90.7 kg (200 lb)] 85.6 kg (188 lb 12.8 oz) (09/26 1224) Physical Exam  Constitutional: He is oriented to person, place, and time.  HENT: anicteric Mouth/Throat: Oropharynx is clear and moist. No oropharyngeal exudate.  Cardiovascular: Normal rate, regular rhythm and normal heart sounds.  Pulmonary/Chest: Effort normal and breath sounds normal. No respiratory  distress. He has no wheezes.  Abdominal: Soft. Bowel sounds are normal. He exhibits no distension. There is no tenderness.  Lymphadenopathy: He has no cervical adenopathy.  Neurological: He is alert and oriented to person, place, and time.  Skin: Skin is warm and dry. No rash noted. No erythema.  Psychiatric: flat affect  LABS: Results for orders placed or performed during the hospital encounter of 12/29/16 (from the past 48 hour(s))  Basic metabolic panel     Status: Abnormal   Collection Time: 12/29/16  4:57 PM  Result Value Ref Range   Sodium 136 135 - 145 mmol/L   Potassium 3.5 3.5 - 5.1 mmol/L   Chloride 98 (L) 101 - 111 mmol/L   CO2 28 22 - 32 mmol/L   Glucose, Bld 121 (H) 65 - 99 mg/dL   BUN 29 (H) 6 - 20 mg/dL    Creatinine, Ser 1.40 (H) 0.61 - 1.24 mg/dL   Calcium 8.8 (L) 8.9 - 10.3 mg/dL   GFR calc non Af Amer >60 >60 mL/min   GFR calc Af Amer >60 >60 mL/min    Comment: (NOTE) The eGFR has been calculated using the CKD EPI equation. This calculation has not been validated in all clinical situations. eGFR's persistently <60 mL/min signify possible Chronic Kidney Disease.    Anion gap 10 5 - 15  CBC     Status: Abnormal   Collection Time: 12/29/16  4:57 PM  Result Value Ref Range   WBC 32.7 (H) 3.8 - 10.6 K/uL   RBC 4.90 4.40 - 5.90 MIL/uL   Hemoglobin 15.1 13.0 - 18.0 g/dL   HCT 43.5 40.0 - 52.0 %   MCV 88.9 80.0 - 100.0 fL   MCH 30.8 26.0 - 34.0 pg   MCHC 34.6 32.0 - 36.0 g/dL   RDW 13.4 11.5 - 14.5 %   Platelets 146 (L) 150 - 440 K/uL  Urinalysis, Complete w Microscopic     Status: Abnormal   Collection Time: 12/29/16  4:57 PM  Result Value Ref Range   Color, Urine AMBER (A) YELLOW    Comment: BIOCHEMICALS MAY BE AFFECTED BY COLOR   APPearance HAZY (A) CLEAR   Specific Gravity, Urine 1.015 1.005 - 1.030   pH 6.0 5.0 - 8.0   Glucose, UA NEGATIVE NEGATIVE mg/dL   Hgb urine dipstick NEGATIVE NEGATIVE   Bilirubin Urine NEGATIVE NEGATIVE   Ketones, ur NEGATIVE NEGATIVE mg/dL   Protein, ur 30 (A) NEGATIVE mg/dL   Nitrite NEGATIVE NEGATIVE   Leukocytes, UA NEGATIVE NEGATIVE   RBC / HPF 0-5 0 - 5 RBC/hpf   WBC, UA 6-30 0 - 5 WBC/hpf   Bacteria, UA RARE (A) NONE SEEN   Squamous Epithelial / LPF 0-5 (A) NONE SEEN  Hepatic function panel     Status: Abnormal   Collection Time: 12/29/16  7:23 PM  Result Value Ref Range   Total Protein 6.2 (L) 6.5 - 8.1 g/dL   Albumin 3.3 (L) 3.5 - 5.0 g/dL   AST 81 (H) 15 - 41 U/L   ALT 115 (H) 17 - 63 U/L   Alkaline Phosphatase 174 (H) 38 - 126 U/L   Total Bilirubin 2.7 (H) 0.3 - 1.2 mg/dL   Bilirubin, Direct 0.6 (H) 0.1 - 0.5 mg/dL   Indirect Bilirubin 2.1 (H) 0.3 - 0.9 mg/dL  Lipase, blood     Status: None   Collection Time: 12/29/16  7:23 PM   Result Value Ref Range   Lipase 18 11 -  51 U/L  Mononucleosis screen     Status: None   Collection Time: 12/29/16  7:23 PM  Result Value Ref Range   Mono Screen NEGATIVE NEGATIVE  Blood culture (routine x 2)     Status: None (Preliminary result)   Collection Time: 12/29/16  7:24 PM  Result Value Ref Range   Specimen Description BLOOD LEFT ANTECUBITAL    Special Requests      BOTTLES DRAWN AEROBIC AND ANAEROBIC Blood Culture adequate volume   Culture NO GROWTH < 12 HOURS    Report Status PENDING   Blood culture (routine x 2)     Status: None (Preliminary result)   Collection Time: 12/29/16  7:24 PM  Result Value Ref Range   Specimen Description BLOOD BLOOD LEFT HAND    Special Requests      BOTTLES DRAWN AEROBIC AND ANAEROBIC Blood Culture adequate volume   Culture NO GROWTH < 12 HOURS    Report Status PENDING   Lactic acid, plasma     Status: Abnormal   Collection Time: 12/29/16  7:24 PM  Result Value Ref Range   Lactic Acid, Venous 2.3 (HH) 0.5 - 1.9 mmol/L    Comment: CRITICAL RESULT CALLED TO, READ BACK BY AND VERIFIED WITH Debby Freiberg RN AT 2027 12/29/16. MSS   Influenza panel by PCR (type A & B)     Status: None   Collection Time: 12/29/16  8:10 PM  Result Value Ref Range   Influenza A By PCR NEGATIVE NEGATIVE   Influenza B By PCR NEGATIVE NEGATIVE    Comment: (NOTE) The Xpert Xpress Flu assay is intended as an aid in the diagnosis of  influenza and should not be used as a sole basis for treatment.  This  assay is FDA approved for nasopharyngeal swab specimens only. Nasal  washings and aspirates are unacceptable for Xpert Xpress Flu testing.   Lactic acid, plasma     Status: None   Collection Time: 12/29/16  9:23 PM  Result Value Ref Range   Lactic Acid, Venous 1.1 0.5 - 1.9 mmol/L  POCT rapid strep A [facility])     Status: None   Collection Time: 12/29/16  9:54 PM  Result Value Ref Range   Streptococcus, Group A Screen (Direct) NEGATIVE NEGATIVE  APTT      Status: Abnormal   Collection Time: 12/29/16 10:52 PM  Result Value Ref Range   aPTT 46 (H) 24 - 36 seconds    Comment:        IF BASELINE aPTT IS ELEVATED, SUGGEST PATIENT RISK ASSESSMENT BE USED TO DETERMINE APPROPRIATE ANTICOAGULANT THERAPY.   Protime-INR     Status: Abnormal   Collection Time: 12/29/16 10:52 PM  Result Value Ref Range   Prothrombin Time 15.6 (H) 11.4 - 15.2 seconds   INR 1.25   C difficile quick scan w PCR reflex     Status: Abnormal   Collection Time: 12/29/16 11:32 PM  Result Value Ref Range   C Diff antigen POSITIVE (A) NEGATIVE   C Diff toxin NEGATIVE NEGATIVE   C Diff interpretation Results are indeterminate. See PCR results.     Comment: VALID  Gastrointestinal Panel by PCR , Stool     Status: Abnormal   Collection Time: 12/29/16 11:32 PM  Result Value Ref Range   Campylobacter species NOT DETECTED NOT DETECTED   Plesimonas shigelloides NOT DETECTED NOT DETECTED   Salmonella species NOT DETECTED NOT DETECTED   Yersinia enterocolitica NOT DETECTED NOT  DETECTED   Vibrio species NOT DETECTED NOT DETECTED   Vibrio cholerae NOT DETECTED NOT DETECTED   Enteroaggregative E coli (EAEC) NOT DETECTED NOT DETECTED   Enteropathogenic E coli (EPEC) DETECTED (A) NOT DETECTED    Comment: RESULT CALLED TO, READ BACK BY AND VERIFIED WITH: SYLVIA FUENTES AT 0237 12/30/16 ALV    Enterotoxigenic E coli (ETEC) NOT DETECTED NOT DETECTED   Shiga like toxin producing E coli (STEC) NOT DETECTED NOT DETECTED   Shigella/Enteroinvasive E coli (EIEC) NOT DETECTED NOT DETECTED   Cryptosporidium NOT DETECTED NOT DETECTED   Cyclospora cayetanensis NOT DETECTED NOT DETECTED   Entamoeba histolytica NOT DETECTED NOT DETECTED   Giardia lamblia NOT DETECTED NOT DETECTED   Adenovirus F40/41 NOT DETECTED NOT DETECTED   Astrovirus NOT DETECTED NOT DETECTED   Norovirus GI/GII NOT DETECTED NOT DETECTED   Rotavirus A NOT DETECTED NOT DETECTED   Sapovirus (I, II, IV, and V) NOT DETECTED  NOT DETECTED  Clostridium Difficile by PCR     Status: Abnormal   Collection Time: 12/29/16 11:32 PM  Result Value Ref Range   Toxigenic C Difficile by pcr POSITIVE (A) NEGATIVE    Comment: Positive for toxigenic C. difficile with little to no toxin production. Only treat if clinical presentation suggests symptomatic illness.  Basic metabolic panel     Status: Abnormal   Collection Time: 12/30/16  4:23 AM  Result Value Ref Range   Sodium 137 135 - 145 mmol/L   Potassium 3.3 (L) 3.5 - 5.1 mmol/L   Chloride 102 101 - 111 mmol/L   CO2 28 22 - 32 mmol/L   Glucose, Bld 98 65 - 99 mg/dL   BUN 22 (H) 6 - 20 mg/dL   Creatinine, Ser 1.01 0.61 - 1.24 mg/dL   Calcium 8.3 (L) 8.9 - 10.3 mg/dL   GFR calc non Af Amer >60 >60 mL/min   GFR calc Af Amer >60 >60 mL/min    Comment: (NOTE) The eGFR has been calculated using the CKD EPI equation. This calculation has not been validated in all clinical situations. eGFR's persistently <60 mL/min signify possible Chronic Kidney Disease.    Anion gap 7 5 - 15  CBC     Status: Abnormal   Collection Time: 12/30/16  4:23 AM  Result Value Ref Range   WBC 29.3 (H) 3.8 - 10.6 K/uL   RBC 4.55 4.40 - 5.90 MIL/uL   Hemoglobin 14.0 13.0 - 18.0 g/dL   HCT 40.7 40.0 - 52.0 %   MCV 89.3 80.0 - 100.0 fL   MCH 30.7 26.0 - 34.0 pg   MCHC 34.4 32.0 - 36.0 g/dL   RDW 13.6 11.5 - 14.5 %   Platelets 140 (L) 150 - 440 K/uL   No components found for: ESR, C REACTIVE PROTEIN MICRO: Recent Results (from the past 720 hour(s))  Blood culture (routine x 2)     Status: None (Preliminary result)   Collection Time: 12/29/16  7:24 PM  Result Value Ref Range Status   Specimen Description BLOOD LEFT ANTECUBITAL  Final   Special Requests   Final    BOTTLES DRAWN AEROBIC AND ANAEROBIC Blood Culture adequate volume   Culture NO GROWTH < 12 HOURS  Final   Report Status PENDING  Incomplete  Blood culture (routine x 2)     Status: None (Preliminary result)   Collection Time:  12/29/16  7:24 PM  Result Value Ref Range Status   Specimen Description BLOOD BLOOD LEFT HAND  Final  Special Requests   Final    BOTTLES DRAWN AEROBIC AND ANAEROBIC Blood Culture adequate volume   Culture NO GROWTH < 12 HOURS  Final   Report Status PENDING  Incomplete  C difficile quick scan w PCR reflex     Status: Abnormal   Collection Time: 12/29/16 11:32 PM  Result Value Ref Range Status   C Diff antigen POSITIVE (A) NEGATIVE Final   C Diff toxin NEGATIVE NEGATIVE Final   C Diff interpretation Results are indeterminate. See PCR results.  Final    Comment: VALID  Gastrointestinal Panel by PCR , Stool     Status: Abnormal   Collection Time: 12/29/16 11:32 PM  Result Value Ref Range Status   Campylobacter species NOT DETECTED NOT DETECTED Final   Plesimonas shigelloides NOT DETECTED NOT DETECTED Final   Salmonella species NOT DETECTED NOT DETECTED Final   Yersinia enterocolitica NOT DETECTED NOT DETECTED Final   Vibrio species NOT DETECTED NOT DETECTED Final   Vibrio cholerae NOT DETECTED NOT DETECTED Final   Enteroaggregative E coli (EAEC) NOT DETECTED NOT DETECTED Final   Enteropathogenic E coli (EPEC) DETECTED (A) NOT DETECTED Final    Comment: RESULT CALLED TO, READ BACK BY AND VERIFIED WITH: SYLVIA FUENTES AT 0237 12/30/16 ALV    Enterotoxigenic E coli (ETEC) NOT DETECTED NOT DETECTED Final   Shiga like toxin producing E coli (STEC) NOT DETECTED NOT DETECTED Final   Shigella/Enteroinvasive E coli (EIEC) NOT DETECTED NOT DETECTED Final   Cryptosporidium NOT DETECTED NOT DETECTED Final   Cyclospora cayetanensis NOT DETECTED NOT DETECTED Final   Entamoeba histolytica NOT DETECTED NOT DETECTED Final   Giardia lamblia NOT DETECTED NOT DETECTED Final   Adenovirus F40/41 NOT DETECTED NOT DETECTED Final   Astrovirus NOT DETECTED NOT DETECTED Final   Norovirus GI/GII NOT DETECTED NOT DETECTED Final   Rotavirus A NOT DETECTED NOT DETECTED Final   Sapovirus (I, II, IV, and V) NOT  DETECTED NOT DETECTED Final  Clostridium Difficile by PCR     Status: Abnormal   Collection Time: 12/29/16 11:32 PM  Result Value Ref Range Status   Toxigenic C Difficile by pcr POSITIVE (A) NEGATIVE Final    Comment: Positive for toxigenic C. difficile with little to no toxin production. Only treat if clinical presentation suggests symptomatic illness.    IMAGING: Dg Chest 2 View  Result Date: 12/29/2016 CLINICAL DATA:  Chills and cough EXAM: CHEST  2 VIEW COMPARISON:  07/27/ 2016 FINDINGS: The heart size and mediastinal contours are within normal limits. Both lungs are clear. The visualized skeletal structures are unremarkable. IMPRESSION: No active cardiopulmonary disease. Electronically Signed   By: Donavan Foil M.D.   On: 12/29/2016 19:39   Ct Abdomen Pelvis W Contrast  Result Date: 12/29/2016 CLINICAL DATA:  Fatigue and generalized weakness. EXAM: CT ABDOMEN AND PELVIS WITH CONTRAST TECHNIQUE: Multidetector CT imaging of the abdomen and pelvis was performed using the standard protocol following bolus administration of intravenous contrast. CONTRAST:  139m ISOVUE-300 IOPAMIDOL (ISOVUE-300) INJECTION 61% COMPARISON:  09/18/2008 FINDINGS: Lower chest: Top-normal sized included heart. No pericardial effusion. Bibasilar dependent atelectasis. Hepatobiliary: No focal liver abnormality is seen. No gallstones, gallbladder wall thickening, or biliary dilatation. Pancreas: Unremarkable. No pancreatic ductal dilatation or surrounding inflammatory changes. Spleen: Normal in size without focal abnormality. Adrenals/Urinary Tract: Adrenal glands are unremarkable. Kidneys are normal, without renal calculi, focal lesion, or hydronephrosis. Bladder is unremarkable. Stomach/Bowel: Moderate distention of the stomach with enteric contrast and food. Normal small bowel rotation. No  bowel obstruction or inflammation. A moderate amount retained fecal residue is seen from cecum to splenic flexure. The appendix is not  confidently identified however no pericecal inflammation or findings of acute appendicitis. Vascular/Lymphatic: No significant vascular findings are present. No enlarged abdominal or pelvic lymph nodes. Reproductive: Normal size prostate with central and peripheral zone calcifications. Seminal vesicles are unremarkable. Other: No abdominal wall hernia or abnormality. No abdominopelvic ascites. Musculoskeletal: No acute or significant osseous findings. IMPRESSION: 1. Increased colonic stool burden, query constipation. 2. No acute solid nor hollow visceral organ abnormality is otherwise noted. Electronically Signed   By: Ashley Royalty M.D.   On: 12/29/2016 20:17    Assessment:   STEVEN BASSO is a 36 y.o. male admitted with chills, malaise, diarrhea and found to have  Leukocytosis, mild elevation LFT, mild TCP and C diff + as well as EPEC on Stool PCR. Had recent outdoor exposure and rash. Denies recent abx. Denies any IVDU but does use marijuana and daily ETOH. HIV negative.   Somewhat better since admit.  Likely community acquired C diff but no real risk factors. Has multiple cuts and had recent wrist laceration from work so could have become bacteremia but bcx negative.   Recommendations Cont doxy x 5 days Cont oral vanco for 10 days after stopping doxy Await further tests  If wbc improving and LFTs improving can dc in next 1-2 days Thank you very much for allowing me to participate in the care of this patient. Please call with questions.   Cheral Marker. Ola Spurr, MD

## 2016-12-31 LAB — CBC
HEMATOCRIT: 41.6 % (ref 40.0–52.0)
HEMOGLOBIN: 14.2 g/dL (ref 13.0–18.0)
MCH: 30.5 pg (ref 26.0–34.0)
MCHC: 34 g/dL (ref 32.0–36.0)
MCV: 89.5 fL (ref 80.0–100.0)
Platelets: 136 10*3/uL — ABNORMAL LOW (ref 150–440)
RBC: 4.65 MIL/uL (ref 4.40–5.90)
RDW: 13.9 % (ref 11.5–14.5)
WBC: 16.6 10*3/uL — AB (ref 3.8–10.6)

## 2016-12-31 LAB — URINE CULTURE

## 2016-12-31 LAB — BASIC METABOLIC PANEL
ANION GAP: 8 (ref 5–15)
BUN: 9 mg/dL (ref 6–20)
CO2: 27 mmol/L (ref 22–32)
Calcium: 8.7 mg/dL — ABNORMAL LOW (ref 8.9–10.3)
Chloride: 106 mmol/L (ref 101–111)
Creatinine, Ser: 0.68 mg/dL (ref 0.61–1.24)
GFR calc non Af Amer: >60 mL/min (ref >60)
GLUCOSE: 82 mg/dL (ref 65–99)
POTASSIUM: 4 mmol/L (ref 3.5–5.1)
Sodium: 141 mmol/L (ref 135–145)

## 2016-12-31 LAB — HEPATITIS PANEL, ACUTE
HCV Ab: >11 s/co ratio — ABNORMAL HIGH (ref 0.0–0.9)
HEP B C IGM: NEGATIVE
Hep A IgM: NEGATIVE
Hepatitis B Surface Ag: NEGATIVE

## 2016-12-31 LAB — HIV ANTIBODY (ROUTINE TESTING W REFLEX): HIV Screen 4th Generation wRfx: NONREACTIVE

## 2016-12-31 LAB — CULTURE, GROUP A STREP (THRC)

## 2016-12-31 LAB — B. BURGDORFI ANTIBODIES

## 2016-12-31 MED ORDER — VANCOMYCIN HCL 125 MG PO CAPS
125.0000 mg | ORAL_CAPSULE | Freq: Four times a day (QID) | ORAL | 0 refills | Status: AC
Start: 2016-12-31 — End: 2017-01-15

## 2016-12-31 MED ORDER — DOXYCYCLINE HYCLATE 100 MG PO CAPS: 100.0000 mg | ORAL_CAPSULE | Freq: Two times a day (BID) | ORAL | 0 refills | Status: AC

## 2016-12-31 MED ORDER — VANCOMYCIN 50 MG/ML ORAL SOLUTION
125.0000 mg | Freq: Four times a day (QID) | ORAL | 0 refills | Status: DC
Start: 2016-12-31 — End: 2016-12-31

## 2016-12-31 NOTE — Progress Notes (Signed)
Discharge instructions along with home medications and follow up gone over with patient and mother. Both verbalize that they understood instructions. Vancomycin and doxycycline given to patient. IV's removed. Pt being discharged home on room air, no distress noted. Ericia Moxley S Fenton

## 2016-12-31 NOTE — Care Management (Signed)
Sent patient's [facility]cycline [facility] Completed a MATCH referral for patient Vancomycin and the cost would be 1800 dollars which requires an over ride by CM Director. have discussed using [facility] pharmacy for suspension which would be much less cost.  Have reached out to Nicolasa Ducking for approval. Provided patient with applications for [facility]

## 2016-12-31 NOTE — Clinical Social Work Note (Signed)
CSW received consult that patient wanted some information about possible alternative housing options.  CSW provided case Production designer, theatre/television/film with [employer] list of resources in [place].  Case manager was going to give list to patient.  CSW to sign off.  Ervin Knack. Urho Rio, MSW, Theresia Majors [phone]  12/31/2016 2:21 PM

## 2016-12-31 NOTE — Progress Notes (Addendum)
[facility] [place] at [facility] [place] was admitted to the Hospital on 12/29/2016 and Discharged  12/31/2016 and should  be excused from work/school   for  7 days starting 12/28/2016 , may return to work/school without any restrictions.  Call Auburn Bilberry MD with questions.  Auburn Bilberry M.D on 12/31/2016,at 9:32 AM  [facility] [place] at [facility]  [phone]

## 2016-12-31 NOTE — Discharge Summary (Signed)
[facility] [place] at [facility] [place], [place] y.o., DOB [birth year], MRN [id number]. Admission date: 12/29/2016 Discharge Date 12/31/2016 Primary MD Patient, No Pcp Per Admitting Physician Oralia Manis, MD  Admission Diagnosis  Hyperbilirubinemia [E80.6] Sepsis, due to unspecified organism [facility]) [A41.9]  Discharge Diagnosis   Principal Problem:   Sepsis due to C. difficile colitis   C. difficile colitis   Tick exposure   Hepatitis C   Hypokalemia   PTSD       Hospital Course  Jerry Herrera  is a 36 y.o. male who presents with Chills, malaise, fatigue, diarrhea. Patient states that he began feeling bad about 3 days ago. A couple of days prior to that he noticed a target-like rash on his inner thigh. [place] area, but does not specifically recall any tick bite. Here in the ED he was found to have tachycardia, leukocytosis. Specific etiology of his infection is not clear. UA does not indicate infection, chest x-ray does not indicate pneumonia. CT scan of his abdomen and pelvis does not show any clear sign of infection. His stools were checked and was positive for C. difficile antigen. In light of his diarrhea and significant leukocytosis he has been treated for C. difficile colitis. Patient will need total of 15 days of outpatient vancomycin due to him being on doxycycline for the next 5 days. Patient also complained of tick exposure and with elevated LFTs initially he was treated with doxycycline which has been continued.  Patient was seen in consultation by ID who agreed with the plan. Patient also was noted to have positive hepatitis C. She will be referred to [facility] hepatitis clinic for this.          Consults  ID   Significant Tests:  See full reports for all details     Dg Chest 2 View  Result Date: 12/29/2016 CLINICAL DATA:  Chills and cough EXAM: CHEST  2 VIEW COMPARISON:  07/27/ 2016 FINDINGS: The  heart size and mediastinal contours are within normal limits. Both lungs are clear. The visualized skeletal structures are unremarkable. IMPRESSION: No active cardiopulmonary disease. Electronically Signed   By: Jasmine Pang M.D.   On: 12/29/2016 19:39   Ct Abdomen Pelvis W Contrast  Result Date: 12/29/2016 CLINICAL DATA:  Fatigue and generalized weakness. EXAM: CT ABDOMEN AND PELVIS WITH CONTRAST TECHNIQUE: Multidetector CT imaging of the abdomen and pelvis was performed using the standard protocol following bolus administration of intravenous contrast. CONTRAST:  ISOVUE-300 IOPAMIDOL (ISOVUE-300) INJECTION 61% COMPARISON:  09/18/2008 FINDINGS: Lower chest: Top-normal sized included heart. No pericardial effusion. Bibasilar dependent atelectasis. Hepatobiliary: No focal liver abnormality is seen. No gallstones, gallbladder wall thickening, or biliary dilatation. Pancreas: Unremarkable. No pancreatic ductal dilatation or surrounding inflammatory changes. Spleen: Normal in size without focal abnormality. Adrenals/Urinary Tract: Adrenal glands are unremarkable. Kidneys are normal, without renal calculi, focal lesion, or hydronephrosis. Bladder is unremarkable. Stomach/Bowel: Moderate distention of the stomach with enteric contrast and food. Normal small bowel rotation. No bowel obstruction or inflammation. A moderate amount retained fecal residue is seen from cecum to splenic flexure. The appendix is not confidently identified however no pericecal inflammation or findings of acute appendicitis. Vascular/Lymphatic: No significant vascular findings are present. No enlarged abdominal or pelvic lymph nodes. Reproductive: Normal size prostate with central and peripheral zone calcifications. Seminal vesicles are unremarkable. Other: No abdominal wall hernia or abnormality. No abdominopelvic ascites. Musculoskeletal: No acute or  significant osseous findings. IMPRESSION: 1. Increased colonic stool burden, query  constipation. 2. No acute solid nor hollow visceral organ abnormality is otherwise noted. Electronically Signed   By: Tollie Eth M.D.   On: 12/29/2016 20:17       Today   Subjective:   Jerry Herrera feeling better his white blood cell count has decreased. Patient has not had any fevers joint pain improved  Objective:   Blood pressure 118/63, pulse 65, temperature 97.7 F (36.5 C), temperature source Oral, resp. rate 20, height  (1.803 m), weight 188 lb 12.8 oz (85.6 kg), SpO2 98 %.  .  Intake/Output Summary (Last 24 hours) at 12/31/16 1211 Last data filed at 12/30/16 2035  Gross per 24 hour  Intake                0 ml  Output              500 ml  Net             -500 ml    Exam VITAL SIGNS: Blood pressure 118/63, pulse 65, temperature 97.7 F (36.5 C), temperature source Oral, resp. rate 20, height  (1.803 m), weight 188 lb 12.8 oz (85.6 kg), SpO2 98 %.  GENERAL:  36 y.o.-year-old patient lying in the bed with no acute distress.  EYES: Pupils equal, round, reactive to light and accommodation. No scleral icterus. Extraocular muscles intact.  HEENT: Head atraumatic, normocephalic. Oropharynx and nasopharynx clear.  NECK:  Supple, no jugular venous distention. No thyroid enlargement, no tenderness.  LUNGS: Normal breath sounds bilaterally, no wheezing, rales,rhonchi or crepitation. No use of accessory muscles of respiration.  CARDIOVASCULAR: S1, S2 normal. No murmurs, rubs, or gallops.  ABDOMEN: Soft, nontender, nondistended. Bowel sounds present. No organomegaly or mass.  EXTREMITIES: No pedal edema, cyanosis, or clubbing.  NEUROLOGIC: Cranial nerves II through XII are intact. Muscle strength 5/5 in all extremities. Sensation intact. Gait not checked.  PSYCHIATRIC: The patient is alert and oriented x 3.  SKIN: No obvious rash, lesion, or ulcer.   Data Review     CBC w Diff: Lab Results  Component Value Date   WBC 16.6 (H) 12/31/2016   HGB 14.2  12/31/2016   HGB 14.1 01/14/2014   HCT 41.6 12/31/2016   HCT 41.9 01/14/2014   PLT 136 (L) 12/31/2016   PLT 266 01/14/2014   LYMPHOPCT 8 12/30/2016   MONOPCT 5 12/30/2016   EOSPCT 0 12/30/2016   BASOPCT 0 12/30/2016   CMP: Lab Results  Component Value Date   NA 141 12/31/2016   NA 143 01/14/2014   K 4.0 12/31/2016   K 4.0 01/14/2014   CL 106 12/31/2016   CL 106 01/14/2014   CO2 27 12/31/2016   CO2 32 01/14/2014   BUN 9 12/31/2016   BUN 13 01/14/2014   CREATININE 0.68 12/31/2016   CREATININE 1.03 01/14/2014   PROT 5.4 (L) 12/30/2016   PROT 7.0 01/14/2014   ALBUMIN 2.8 (L) 12/30/2016   ALBUMIN 3.9 01/14/2014   BILITOT 1.8 (H) 12/30/2016   BILITOT 0.6 01/14/2014   ALKPHOS 173 (H) 12/30/2016   ALKPHOS 78 01/14/2014   AST 61 (H) 12/30/2016   AST 24 01/14/2014   ALT 93 (H) 12/30/2016   ALT 28 01/14/2014  .  Micro Results Recent Results (from the past 240 hour(s))  Urine Culture     Status: Abnormal   Collection Time: 12/29/16  4:57 PM  Result Value Ref Range Status  Specimen Description URINE, RANDOM  Final   Special Requests NONE  Final   Culture (A)  Final    <10,000 COLONIES/mL INSIGNIFICANT GROWTH Performed at [facility], [address] [place], [place] [address]    Report Status 12/31/2016 FINAL  Final  Blood culture (routine x 2)     Status: None (Preliminary result)   Collection Time: 12/29/16  7:24 PM  Result Value Ref Range Status   Specimen Description BLOOD LEFT ANTECUBITAL  Final   Special Requests   Final    BOTTLES DRAWN AEROBIC AND ANAEROBIC Blood Culture adequate volume   Culture NO GROWTH 2 DAYS  Final   Report Status PENDING  Incomplete  Blood culture (routine x 2)     Status: None (Preliminary result)   Collection Time: 12/29/16  7:24 PM  Result Value Ref Range Status   Specimen Description BLOOD BLOOD LEFT HAND  Final   Special Requests   Final    BOTTLES DRAWN AEROBIC AND ANAEROBIC Blood Culture adequate volume   Culture NO  GROWTH 2 DAYS  Final   Report Status PENDING  Incomplete  C difficile quick scan w PCR reflex     Status: Abnormal   Collection Time: 12/29/16 11:32 PM  Result Value Ref Range Status   C Diff antigen POSITIVE (A) NEGATIVE Final   C Diff toxin NEGATIVE NEGATIVE Final   C Diff interpretation Results are indeterminate. See PCR results.  Final    Comment: VALID  Gastrointestinal Panel by PCR , Stool     Status: Abnormal   Collection Time: 12/29/16 11:32 PM  Result Value Ref Range Status   Campylobacter species NOT DETECTED NOT DETECTED Final   Plesimonas shigelloides NOT DETECTED NOT DETECTED Final   Salmonella species NOT DETECTED NOT DETECTED Final   Yersinia enterocolitica NOT DETECTED NOT DETECTED Final   Vibrio species NOT DETECTED NOT DETECTED Final   Vibrio cholerae NOT DETECTED NOT DETECTED Final   Enteroaggregative E coli (EAEC) NOT DETECTED NOT DETECTED Final   Enteropathogenic E coli (EPEC) DETECTED (A) NOT DETECTED Final    Comment: RESULT CALLED TO, READ BACK BY AND VERIFIED WITH: SYLVIA FUENTES AT 0237 12/30/16 ALV    Enterotoxigenic E coli (ETEC) NOT DETECTED NOT DETECTED Final   Shiga like toxin producing E coli (STEC) NOT DETECTED NOT DETECTED Final   Shigella/Enteroinvasive E coli (EIEC) NOT DETECTED NOT DETECTED Final   Cryptosporidium NOT DETECTED NOT DETECTED Final   Cyclospora cayetanensis NOT DETECTED NOT DETECTED Final   Entamoeba histolytica NOT DETECTED NOT DETECTED Final   Giardia lamblia NOT DETECTED NOT DETECTED Final   Adenovirus F40/41 NOT DETECTED NOT DETECTED Final   Astrovirus NOT DETECTED NOT DETECTED Final   Norovirus GI/GII NOT DETECTED NOT DETECTED Final   Rotavirus A NOT DETECTED NOT DETECTED Final   Sapovirus (I, II, IV, and V) NOT DETECTED NOT DETECTED Final  Clostridium Difficile by PCR     Status: Abnormal   Collection Time: 12/29/16 11:32 PM  Result Value Ref Range Status   Toxigenic C Difficile by pcr POSITIVE (A) NEGATIVE Final     Comment: Positive for toxigenic C. difficile with little to no toxin production. Only treat if clinical presentation suggests symptomatic illness.        Code Status Orders        Start     Ordered   12/30/16 0113  Full code  Continuous     12/30/16 0112    Code Status History  Date Active Date Inactive Code Status Order ID Comments User Context   This patient has a current code status but no historical code status.          Follow-up Information    [facility] 3 week(s).   Why:  new diagonosis of hep c          Discharge Medications   Allergies as of 12/31/2016   No Known Allergies     Medication List    TAKE these medications   doxycycline 100 MG capsule Commonly known as:  VIBRAMYCIN Take 1 capsule (100 mg total) by mouth 2 (two) times daily.   vancomycin 125 MG capsule Commonly known as:  VANCOCIN Take 1 capsule (125 mg total) by mouth 4 (four) times daily.            Discharge Care Instructions        Start     Ordered   12/31/16 0000  vancomycin (VANCOCIN) 125 MG capsule  4 times daily     12/31/16 0919   12/31/16 0000  doxycycline (VIBRAMYCIN) 100 MG capsule  2 times daily     12/31/16 0919         Total Time in preparing paper work, data evaluation and todays exam - 35 minutes  Auburn Bilberry M.D on 12/31/2016 at 12:11 PM  [facility]   Office  [phone]

## 2016-12-31 NOTE — Care Management (Signed)
If patient discharges on oral vancomycin, will need MATCH referral. [facility] does not have vancomycin on the formulary.  WBC down to 16.6 today

## 2016-12-31 NOTE — Discharge Instructions (Addendum)
[facility] - [place] at [facility]  DIET:  Regular diet  DISCHARGE CONDITION:  Stable  ACTIVITY:  Activity as tolerated  OXYGEN:  Home Oxygen: No.   Oxygen Delivery: room air  DISCHARGE LOCATION:  home    ADDITIONAL DISCHARGE INSTRUCTION: Resume home medication as taking before   Also need to take otc abreva to apply on lips for the cold sores.   If you experience worsening of your admission symptoms, develop shortness of breath, life threatening emergency, suicidal or homicidal thoughts you must seek medical attention immediately by calling 911 or calling your MD immediately  if symptoms less severe.  You Must read complete instructions/literature along with all the possible adverse reactions/side effects for all the Medicines you take and that have been prescribed to you. Take any new Medicines after you have completely understood and accpet all the possible adverse reactions/side effects.   Please note  You were cared for by a hospitalist during your hospital stay. If you have any questions about your discharge medications or the care you received while you were in the hospital after you are discharged, you can call the unit and asked to speak with the hospitalist on call if the hospitalist that took care of you is not available. Once you are discharged, your primary care physician will handle any further medical issues. Please note that NO REFILLS for any discharge medications will be authorized once you are discharged, as it is imperative that you return to your primary care physician (or establish a relationship with a primary care physician if you do not have one) for your aftercare needs so that they can reassess your need for medications and monitor your lab values.

## 2017-01-01 LAB — HCV RNA QUANT
HCV QUANT LOG: 4.91 {Log_IU}/mL (ref >1.70)
HCV Quantitative: 81200 IU/mL (ref >50)

## 2017-01-01 LAB — ROCKY MTN SPOTTED FVR ABS PNL(IGG+IGM)
RMSF IGG: NEGATIVE
RMSF IGM: 0.37 {index} (ref 0.00–0.89)
RMSF IgG: NEGATIVE
RMSF IgM: 0.42 index (ref 0.00–0.89)

## 2017-01-03 LAB — CULTURE, BLOOD (ROUTINE X 2)
CULTURE: NO GROWTH
CULTURE: NO GROWTH
Special Requests: ADEQUATE
Special Requests: ADEQUATE

## 2017-01-03 LAB — HEPATITIS C GENOTYPE

## 2017-01-05 LAB — LYME DISEASE DNA BY PCR(BORRELIA BURG)

## 2017-05-30 ENCOUNTER — Emergency Department
Admission: EM | Admit: 2017-05-30 | Discharge: 2017-05-30 | Disposition: A | Discharge status: Home / Self Care | Payer: Self-pay | Attending: Emergency Medicine | Admitting: Emergency Medicine

## 2017-05-30 ENCOUNTER — Other Ambulatory Visit: Payer: Self-pay

## 2017-05-30 ENCOUNTER — Encounter: Payer: Self-pay | Admitting: Emergency Medicine

## 2017-05-30 ENCOUNTER — Emergency Department: Payer: Self-pay

## 2017-05-30 DIAGNOSIS — F319 Bipolar disorder, unspecified: Secondary | ICD-10-CM | POA: Insufficient documentation

## 2017-05-30 DIAGNOSIS — N50819 Testicular pain, unspecified: Secondary | ICD-10-CM

## 2017-05-30 DIAGNOSIS — Z87891 Personal history of nicotine dependence: Secondary | ICD-10-CM | POA: Insufficient documentation

## 2017-05-30 DIAGNOSIS — F419 Anxiety disorder, unspecified: Secondary | ICD-10-CM | POA: Insufficient documentation

## 2017-05-30 DIAGNOSIS — N5089 Other specified disorders of the male genital organs: Secondary | ICD-10-CM | POA: Insufficient documentation

## 2017-05-30 LAB — URINALYSIS, COMPLETE (UACMP) WITH MICROSCOPIC
BACTERIA UA: NONE SEEN
Bilirubin Urine: NEGATIVE
Glucose, UA: NEGATIVE mg/dL
Hgb urine dipstick: NEGATIVE
Ketones, ur: NEGATIVE mg/dL
Leukocytes, UA: NEGATIVE
Nitrite: NEGATIVE
Protein, ur: NEGATIVE mg/dL
SPECIFIC GRAVITY, URINE: 1.006 (ref 1.005–1.030)
pH: 8 (ref 5.0–8.0)

## 2017-05-30 LAB — CHLAMYDIA/NGC RT PCR (ARMC ONLY)
CHLAMYDIA TR: NOT DETECTED
N GONORRHOEAE: NOT DETECTED

## 2017-05-30 MED ORDER — HYDROCODONE-ACETAMINOPHEN 5-325 MG PO TABS
ORAL_TABLET | ORAL | Status: AC
  Filled 2017-05-30: qty 2

## 2017-05-30 MED ORDER — IBUPROFEN 800 MG PO TABS
800.0000 mg | ORAL_TABLET | Freq: Once | ORAL | Status: AC
  Administered 2017-05-30: 800 mg via ORAL

## 2017-05-30 MED ORDER — IBUPROFEN 800 MG PO TABS
ORAL_TABLET | ORAL | Status: AC
  Filled 2017-05-30: qty 1

## 2017-05-30 MED ORDER — HYDROCODONE-ACETAMINOPHEN 5-325 MG PO TABS
2.0000 | ORAL_TABLET | Freq: Once | ORAL | Status: AC
  Administered 2017-05-30: 2 via ORAL

## 2017-05-30 MED ORDER — OXYCODONE-ACETAMINOPHEN 5-325 MG PO TABS: 1.0000 | ORAL_TABLET | ORAL | 0 refills | Status: DC | PRN

## 2017-05-30 NOTE — Discharge Instructions (Signed)
Please take your pain medication as needed for severe symptoms and follow-up with your urologist this coming week for reevaluation.  Return to the emergency department sooner for any new or worsening symptoms such as fevers, chills, worsening pain, if you cannot eat or drink, or for any other issues whatsoever.  It was a pleasure to take care of you today, and thank you for coming to our emergency department.  If you have any questions or concerns before leaving please ask the nurse to grab me and I'm more than happy to go through your aftercare instructions again.  If you were prescribed any opioid pain medication today such as Norco, Vicodin, Percocet, morphine, hydrocodone, or oxycodone please make sure you do not drive when you are taking this medication as it can alter your ability to drive safely.  If you have any concerns once you are home that you are not improving or are in fact getting worse before you can make it to your follow-up appointment, please do not hesitate to call 911 and come back for further evaluation.  Merrily BrittleNeil Viyan Rosamond, MD  Results for orders placed or performed during the hospital encounter of 05/30/17  Urinalysis, Complete w Microscopic  Result Value Ref Range   Color, Urine YELLOW (A) YELLOW   APPearance CLEAR (A) CLEAR   Specific Gravity, Urine 1.006 1.005 - 1.030   pH 8.0 5.0 - 8.0   Glucose, UA NEGATIVE NEGATIVE mg/dL   Hgb urine dipstick NEGATIVE NEGATIVE   Bilirubin Urine NEGATIVE NEGATIVE   Ketones, ur NEGATIVE NEGATIVE mg/dL   Protein, ur NEGATIVE NEGATIVE mg/dL   Nitrite NEGATIVE NEGATIVE   Leukocytes, UA NEGATIVE NEGATIVE   RBC / HPF 0-5 0 - 5 RBC/hpf   WBC, UA 0-5 0 - 5 WBC/hpf   Bacteria, UA NONE SEEN NONE SEEN   Squamous Epithelial / LPF 0-5 (A) NONE SEEN   [place] Scrotum  Result Date: 05/30/2017 CLINICAL DATA:  Swelling and pain of the scrotum EXAM: SCROTAL ULTRASOUND DOPPLER ULTRASOUND OF THE TESTICLES TECHNIQUE: Complete ultrasound examination of  the testicles, epididymis, and other scrotal structures was performed. Color and spectral Doppler ultrasound were also utilized to evaluate blood flow to the testicles. COMPARISON:  None. FINDINGS: Right testicle Measurements: 3.2 x 2.4 x 3.1 cm.  No mass or microlithiasis. Left testicle Measurements: 4.3 x 2.7 x 3.0 cm. No mass or microlithiasis visualized. Right epididymis:  Normal in size and appearance. Left epididymis:  Normal in size and appearance. Hydrocele:  None visualized. Varicocele:  None visualized. Pulsed Doppler interrogation of both testes demonstrates normal low resistance arterial and venous waveforms bilaterally. IMPRESSION: Normal scrotal ultrasound.  No testicular torsion. Electronically Signed   By: Deatra RobinsonKevin  Herman M.D.   On: 05/30/2017 16:59   [place] Abdominal Pelvic Art/vent Flow Doppler  Result Date: 05/30/2017 CLINICAL DATA:  Swelling and pain of the scrotum EXAM: SCROTAL ULTRASOUND DOPPLER ULTRASOUND OF THE TESTICLES TECHNIQUE: Complete ultrasound examination of the testicles, epididymis, and other scrotal structures was performed. Color and spectral Doppler ultrasound were also utilized to evaluate blood flow to the testicles. COMPARISON:  None. FINDINGS: Right testicle Measurements: 3.2 x 2.4 x 3.1 cm.  No mass or microlithiasis. Left testicle Measurements: 4.3 x 2.7 x 3.0 cm. No mass or microlithiasis visualized. Right epididymis:  Normal in size and appearance. Left epididymis:  Normal in size and appearance. Hydrocele:  None visualized. Varicocele:  None visualized. Pulsed Doppler interrogation of both testes demonstrates normal low resistance arterial and venous waveforms bilaterally. IMPRESSION:  Normal scrotal ultrasound.  No testicular torsion. Electronically Signed   By: Deatra Robinson M.D.   On: 05/30/2017 16:59

## 2017-05-30 NOTE — ED Triage Notes (Signed)
Pt reports that his testicles started hurting yesterday, no injury noted. Pts wife reports they are more swollen than normal.

## 2017-05-30 NOTE — ED Provider Notes (Signed)
[facility] Emergency Department Provider Note  ____________________________________________   First MD Initiated Contact with Patient 05/30/17 1740     (approximate)  I have reviewed the triage vital signs and the nursing notes.   HISTORY  Chief Complaint Testicle Pain    HPI Jerry Herrera is a 37 y.o. male self presents to the emergency department with roughly 3 days of gradual onset severe left testicular pain.  Nontraumatic.  The pain is constant and clearly worse with movement and minimally improved with rest.  No penile discharge.  No dysuria frequency or hesitancy.  No back pain.  No history of the same.  Denies abdominal pain.  Past Medical History:  Diagnosis Date  . Anxiety   . Bipolar 1 disorder (HCC)   . Fatty liver   . Polysubstance abuse (HCC)   . PTSD (post-traumatic stress disorder)     Patient Active Problem List   Diagnosis Date Noted  . Sepsis (HCC) 12/29/2016  . Target rash 12/29/2016  . PTSD (post-traumatic stress disorder) 12/29/2016    Past Surgical History:  Procedure Laterality Date  . NO PAST SURGERIES      Prior to Admission medications   Not on File    Allergies Patient has no known allergies.  History reviewed. No pertinent family history.  Social History Social History   Tobacco Use  . Smoking status: Former Games developermoker  . Smokeless tobacco: Never Used  Substance Use Topics  . Alcohol use: Yes    Alcohol/week: 7.2 oz    Types: 12 Cans of beer per week    Comment: 3 times at week  . Drug use: Yes    Types: Marijuana    Review of Systems Constitutional: No fever/chills Eyes: No visual changes. ENT: No sore throat. Cardiovascular: Denies chest pain. Respiratory: Denies shortness of breath. Gastrointestinal: No abdominal pain.  No nausea, no vomiting.  No diarrhea.  No constipation. Genitourinary: Negative for dysuria. Musculoskeletal: Negative for back pain. Skin: Negative for  rash. Neurological: Negative for headaches, focal weakness or numbness.   ____________________________________________   PHYSICAL EXAM:  VITAL SIGNS: ED Triage Vitals  Enc Vitals Group     BP 05/30/17 1541 126/67     Pulse Rate 05/30/17 1541 (!) 104     Resp 05/30/17 1541 20     Temp 05/30/17 1541 99.2 F (37.3 C)     Temp Source 05/30/17 1541 Oral     SpO2 05/30/17 1541 98 %     Weight 05/30/17 1542 189 lb (85.7 kg)     Height 05/30/17 1542 5\' 11"  (1.803 m)     Head Circumference --      Peak Flow --      Pain Score 05/30/17 1542 8     Pain Loc --      Pain Edu? --      Excl. in GC? --     Constitutional: Alert and oriented x4 tearful and extremely uncomfortable appearing Eyes: PERRL EOMI. Head: Atraumatic. Nose: No congestion/rhinnorhea. Mouth/Throat: No trismus Neck: No stridor.   Cardiovascular: Normal rate, regular rhythm. Grossly normal heart sounds.  Good peripheral circulation. Respiratory: Normal respiratory effort.  No retractions. Lungs CTAB and moving good air Gastrointestinal: Soft nontender Normal testes normal lie somewhat tender above his left testicle.  Circumcised phallus no discharge Musculoskeletal: No lower extremity edema   Neurologic:  Normal speech and language. No gross focal neurologic deficits are appreciated. Skin:  Skin is warm, dry and intact. No rash  noted. Psychiatric: Mood and affect are normal. Speech and behavior are normal.    ____________________________________________   DIFFERENTIAL includes but not limited to  Testicular torsion, epididymitis, scrotal abscess, gonorrhea, chlamydia ____________________________________________   LABS (all labs ordered are listed, but only abnormal results are displayed)  Labs Reviewed  CHLAMYDIA/NGC RT PCR ([facility] ONLY)  URINALYSIS, COMPLETE (UACMP) WITH MICROSCOPIC    Lab work reviewed by me with no acute  disease __________________________________________  EKG   ____________________________________________  RADIOLOGY  Reticular ultrasound reviewed by me with no acute disease ____________________________________________   PROCEDURES  Procedure(s) performed: no  Procedures  Critical Care performed: o  Observation: no ____________________________________________   INITIAL IMPRESSION / ASSESSMENT AND PLAN / ED COURSE  Pertinent labs & imaging results that were available during my care of the patient were reviewed by me and considered in my medical decision making (see chart for details).  Fortunately the patient's ultrasound is reassuring.  His symptoms are not intermittent and are not consistent with torsion.  He does not have a bill Clapper's deformity.  He has normal cremasteric reflex bilaterally.  I had a lengthy discussion with the patient regarding the diagnostic uncertainty and the importance of following up with urology as an outpatient.  He has no evidence of infection and his pain is improved with Percocet.  I will prescribe him a short course with urology follow-up.  He verbalizes understanding and agreement with the plan.  He does have a friend at bedside who will drive him home.      ____________________________________________   FINAL CLINICAL IMPRESSION(S) / ED DIAGNOSES  Final diagnoses:  Swelling of scrotum      NEW MEDICATIONS STARTED DURING THIS VISIT:  New Prescriptions   No medications on file     Note:  This document was prepared using Dragon voice recognition software and may include unintentional dictation errors.     Merrily Brittle, MD 05/31/17 [phone]

## 2017-05-30 NOTE — ED Notes (Signed)
SANDWICH TRAY AND WATER PROVIDED TO PT PER REQUEST WITH MD APPROVAL

## 2018-03-03 ENCOUNTER — Encounter (HOSPITAL_COMMUNITY): Payer: Self-pay | Admitting: Emergency Medicine

## 2018-03-03 ENCOUNTER — Other Ambulatory Visit: Payer: Self-pay

## 2018-03-03 ENCOUNTER — Emergency Department (HOSPITAL_COMMUNITY)
Admission: EM | Admit: 2018-03-03 | Discharge: 2018-03-03 | Disposition: A | Discharge status: Home / Self Care | Attending: Emergency Medicine | Admitting: Emergency Medicine

## 2018-03-03 ENCOUNTER — Emergency Department (HOSPITAL_COMMUNITY)

## 2018-03-03 DIAGNOSIS — Z87891 Personal history of nicotine dependence: Secondary | ICD-10-CM | POA: Insufficient documentation

## 2018-03-03 DIAGNOSIS — Y999 Unspecified external cause status: Secondary | ICD-10-CM | POA: Insufficient documentation

## 2018-03-03 DIAGNOSIS — S02642A Fracture of ramus of left mandible, initial encounter for closed fracture: Secondary | ICD-10-CM | POA: Diagnosis not present

## 2018-03-03 DIAGNOSIS — Z79899 Other long term (current) drug therapy: Secondary | ICD-10-CM | POA: Insufficient documentation

## 2018-03-03 DIAGNOSIS — Y92149 Unspecified place in prison as the place of occurrence of the external cause: Secondary | ICD-10-CM | POA: Insufficient documentation

## 2018-03-03 DIAGNOSIS — S02640A Fracture of ramus of mandible, unspecified side, initial encounter for closed fracture: Secondary | ICD-10-CM

## 2018-03-03 DIAGNOSIS — Y939 Activity, unspecified: Secondary | ICD-10-CM | POA: Diagnosis not present

## 2018-03-03 DIAGNOSIS — S0993XA Unspecified injury of face, initial encounter: Secondary | ICD-10-CM | POA: Diagnosis present

## 2018-03-03 MED ORDER — AMOXICILLIN 500 MG PO CAPS: 500.0000 mg | ORAL_CAPSULE | Freq: Three times a day (TID) | ORAL | 0 refills | Status: DC

## 2018-03-03 MED ORDER — IBUPROFEN 800 MG PO TABS
800.0000 mg | ORAL_TABLET | Freq: Once | ORAL | Status: AC
  Administered 2018-03-03: 800 mg via ORAL
  Filled 2018-03-03: qty 1

## 2018-03-03 MED ORDER — AMOXICILLIN 250 MG PO CAPS
500.0000 mg | ORAL_CAPSULE | Freq: Once | ORAL | Status: AC
  Administered 2018-03-03: 500 mg via ORAL
  Filled 2018-03-03: qty 2

## 2018-03-03 MED ORDER — HYDROCODONE-ACETAMINOPHEN 5-325 MG PO TABS
2.0000 | ORAL_TABLET | Freq: Once | ORAL | Status: AC
  Administered 2018-03-03: 2 via ORAL
  Filled 2018-03-03: qty 2

## 2018-03-03 MED ORDER — IBUPROFEN 600 MG PO TABS: 600.0000 mg | ORAL_TABLET | Freq: Four times a day (QID) | ORAL | 0 refills | Status: DC

## 2018-03-03 MED ORDER — HYDROCODONE-ACETAMINOPHEN 5-325 MG PO TABS: 1.0000 | ORAL_TABLET | ORAL | 0 refills | Status: DC | PRN

## 2018-03-03 MED ORDER — ONDANSETRON HCL 4 MG PO TABS
4.0000 mg | ORAL_TABLET | Freq: Once | ORAL | Status: AC
  Administered 2018-03-03: 4 mg via ORAL
  Filled 2018-03-03: qty 1

## 2018-03-03 NOTE — Discharge Instructions (Signed)
Your CT scan shows a nondisplaced fracture of the left jaw.  Please apply ice.  Use ibuprofen with breakfast, lunch, dinner, and at bedtime.  Use Amoxil 3 times daily with a meal.  May use Norco for more severe pain.  Norco may cause drowsiness, please do not drive a vehicle, operate machinery, drink alcohol, or participate in activities requiring concentration when taking this medication.  Please see Dr. Jeanice Limurham, or the oral/maxillofacial surgeon of your choice for evaluation of your fracture and for management of the fracture.
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
## 2018-03-12 ENCOUNTER — Other Ambulatory Visit: Payer: Self-pay

## 2018-03-12 ENCOUNTER — Encounter: Payer: Self-pay | Admitting: Emergency Medicine

## 2018-03-12 ENCOUNTER — Emergency Department
Admission: EM | Admit: 2018-03-12 | Discharge: 2018-03-12 | Disposition: A | Discharge status: Home / Self Care | Attending: Emergency Medicine | Admitting: Emergency Medicine

## 2018-03-12 DIAGNOSIS — X58XXXD Exposure to other specified factors, subsequent encounter: Secondary | ICD-10-CM | POA: Insufficient documentation

## 2018-03-12 DIAGNOSIS — S02600D Fracture of unspecified part of body of mandible, subsequent encounter for fracture with routine healing: Secondary | ICD-10-CM | POA: Insufficient documentation

## 2018-03-12 DIAGNOSIS — Z87891 Personal history of nicotine dependence: Secondary | ICD-10-CM | POA: Insufficient documentation

## 2018-03-12 DIAGNOSIS — Z760 Encounter for issue of repeat prescription: Secondary | ICD-10-CM | POA: Insufficient documentation

## 2018-03-12 MED ORDER — HYDROCODONE-ACETAMINOPHEN 5-325 MG PO TABS: 1.0000 | ORAL_TABLET | ORAL | 0 refills | Status: DC | PRN

## 2018-03-12 MED ORDER — IBUPROFEN 600 MG PO TABS: 600.0000 mg | ORAL_TABLET | Freq: Four times a day (QID) | ORAL | 0 refills | Status: DC

## 2018-03-12 MED ORDER — AMOXICILLIN 500 MG PO CAPS: 500.0000 mg | ORAL_CAPSULE | Freq: Three times a day (TID) | ORAL | 0 refills | Status: DC

## 2018-03-12 MED ORDER — AMOXICILLIN 500 MG PO CAPS
500.0000 mg | ORAL_CAPSULE | Freq: Three times a day (TID) | ORAL | 0 refills | Status: DC
Start: 2018-03-12 — End: 2018-03-12
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
## 2018-03-12 NOTE — Discharge Instructions (Signed)
Follow-up with maxillofacial surgeon as scheduled.

## 2018-05-24 ENCOUNTER — Observation Stay
Admission: EM | Admit: 2018-05-24 | Discharge: 2018-05-26 | Disposition: A | Discharge status: Home / Self Care | Payer: Self-pay | Attending: Internal Medicine | Admitting: Internal Medicine

## 2018-05-24 ENCOUNTER — Encounter: Payer: Self-pay | Admitting: Emergency Medicine

## 2018-05-24 ENCOUNTER — Other Ambulatory Visit: Payer: Self-pay

## 2018-05-24 ENCOUNTER — Emergency Department: Payer: Self-pay

## 2018-05-24 DIAGNOSIS — F4310 Post-traumatic stress disorder, unspecified: Secondary | ICD-10-CM | POA: Insufficient documentation

## 2018-05-24 DIAGNOSIS — F1910 Other psychoactive substance abuse, uncomplicated: Secondary | ICD-10-CM | POA: Insufficient documentation

## 2018-05-24 DIAGNOSIS — F419 Anxiety disorder, unspecified: Secondary | ICD-10-CM | POA: Insufficient documentation

## 2018-05-24 DIAGNOSIS — F319 Bipolar disorder, unspecified: Secondary | ICD-10-CM | POA: Insufficient documentation

## 2018-05-24 DIAGNOSIS — I619 Nontraumatic intracerebral hemorrhage, unspecified: Secondary | ICD-10-CM

## 2018-05-24 DIAGNOSIS — S02622D Fracture of subcondylar process of left mandible, subsequent encounter for fracture with routine healing: Secondary | ICD-10-CM | POA: Insufficient documentation

## 2018-05-24 DIAGNOSIS — Z7982 Long term (current) use of aspirin: Secondary | ICD-10-CM | POA: Insufficient documentation

## 2018-05-24 DIAGNOSIS — S020XXA Fracture of vault of skull, initial encounter for closed fracture: Principal | ICD-10-CM

## 2018-05-24 DIAGNOSIS — I629 Nontraumatic intracranial hemorrhage, unspecified: Secondary | ICD-10-CM

## 2018-05-24 DIAGNOSIS — S06350A Traumatic hemorrhage of left cerebrum without loss of consciousness, initial encounter: Secondary | ICD-10-CM | POA: Insufficient documentation

## 2018-05-24 DIAGNOSIS — S0990XA Unspecified injury of head, initial encounter: Secondary | ICD-10-CM

## 2018-05-24 DIAGNOSIS — Z87891 Personal history of nicotine dependence: Secondary | ICD-10-CM | POA: Insufficient documentation

## 2018-05-24 LAB — CBC WITH DIFFERENTIAL/PLATELET
Abs Immature Granulocytes: 0.03 10*3/uL (ref 0.00–0.07)
Basophils Absolute: 0.1 10*3/uL (ref 0.0–0.1)
Basophils Relative: 1 %
Eosinophils Absolute: 0.1 10*3/uL (ref 0.0–0.5)
Eosinophils Relative: 1 %
HCT: 43 % (ref 39.0–52.0)
Hemoglobin: 14.2 g/dL (ref 13.0–17.0)
Immature Granulocytes: 0 %
Lymphocytes Relative: 27 %
Lymphs Abs: 2.4 10*3/uL (ref 0.7–4.0)
MCH: 29.8 pg (ref 26.0–34.0)
MCHC: 33 g/dL (ref 30.0–36.0)
MCV: 90.3 fL (ref 80.0–100.0)
Monocytes Absolute: 0.6 10*3/uL (ref 0.1–1.0)
Monocytes Relative: 7 %
NRBC: 0 % (ref 0.0–0.2)
Neutro Abs: 5.7 10*3/uL (ref 1.7–7.7)
Neutrophils Relative %: 64 %
Platelets: 340 10*3/uL (ref 150–400)
RBC: 4.76 MIL/uL (ref 4.22–5.81)
RDW: 11.9 % (ref 11.5–15.5)
WBC: 8.9 10*3/uL (ref 4.0–10.5)

## 2018-05-24 LAB — URINALYSIS, COMPLETE (UACMP) WITH MICROSCOPIC
Bacteria, UA: NONE SEEN
Bilirubin Urine: NEGATIVE
Glucose, UA: NEGATIVE mg/dL
HGB URINE DIPSTICK: NEGATIVE
Ketones, ur: NEGATIVE mg/dL
Leukocytes,Ua: NEGATIVE
Nitrite: NEGATIVE
Protein, ur: NEGATIVE mg/dL
Specific Gravity, Urine: 1.008 (ref 1.005–1.030)
Squamous Epithelial / HPF: NONE SEEN (ref 0–5)
pH: 6 (ref 5.0–8.0)

## 2018-05-24 LAB — PROTIME-INR
INR: 0.91
Prothrombin Time: 12.2 seconds (ref 11.4–15.2)

## 2018-05-24 LAB — BASIC METABOLIC PANEL
Anion gap: 11 (ref 5–15)
BUN: 11 mg/dL (ref 6–20)
CO2: 23 mmol/L (ref 22–32)
Calcium: 9.4 mg/dL (ref 8.9–10.3)
Chloride: 105 mmol/L (ref 98–111)
Creatinine, Ser: 0.65 mg/dL (ref 0.61–1.24)
GFR calc Af Amer: >60 mL/min (ref >60)
GFR calc non Af Amer: >60 mL/min (ref >60)
Glucose, Bld: 112 mg/dL — ABNORMAL HIGH (ref 70–99)
Potassium: 3.6 mmol/L (ref 3.5–5.1)
SODIUM: 139 mmol/L (ref 135–145)

## 2018-05-24 LAB — URINE DRUG SCREEN, QUALITATIVE (ARMC ONLY)
Amphetamines, Ur Screen: POSITIVE — AB
Barbiturates, Ur Screen: NOT DETECTED
Benzodiazepine, Ur Scrn: NOT DETECTED
COCAINE METABOLITE, UR ~~LOC~~: NOT DETECTED
Cannabinoid 50 Ng, Ur ~~LOC~~: NOT DETECTED
MDMA (Ecstasy)Ur Screen: NOT DETECTED
Methadone Scn, Ur: NOT DETECTED
Opiate, Ur Screen: NOT DETECTED
Phencyclidine (PCP) Ur S: NOT DETECTED
Tricyclic, Ur Screen: NOT DETECTED

## 2018-05-24 LAB — GLUCOSE, CAPILLARY: Glucose-Capillary: 92 mg/dL (ref 70–99)

## 2018-05-24 LAB — MRSA PCR SCREENING: MRSA by PCR: NEGATIVE

## 2018-05-24 MED ORDER — ONDANSETRON HCL 4 MG/2ML IJ SOLN: 4.0000 mg | Freq: Four times a day (QID) | INTRAMUSCULAR | Status: DC | PRN

## 2018-05-24 MED ORDER — ACETAMINOPHEN 650 MG RE SUPP: 650.0000 mg | Freq: Four times a day (QID) | RECTAL | Status: DC | PRN

## 2018-05-24 MED ORDER — ONDANSETRON HCL 4 MG PO TABS: 4.0000 mg | ORAL_TABLET | Freq: Four times a day (QID) | ORAL | Status: DC | PRN

## 2018-05-24 MED ORDER — ACETAMINOPHEN 325 MG PO TABS
650.0000 mg | ORAL_TABLET | Freq: Four times a day (QID) | ORAL | Status: DC | PRN
  Filled 2018-05-24: qty 2

## 2018-05-24 MED ORDER — TETANUS-DIPHTH-ACELL PERTUSSIS 5-2.5-18.5 LF-MCG/0.5 IM SUSP
0.5000 mL | Freq: Once | INTRAMUSCULAR | Status: AC
  Administered 2018-05-24: 0.5 mL via INTRAMUSCULAR
  Filled 2018-05-24: qty 0.5

## 2018-05-24 MED ORDER — KETOROLAC TROMETHAMINE 15 MG/ML IJ SOLN
15.0000 mg | Freq: Four times a day (QID) | INTRAMUSCULAR | Status: DC | PRN
  Administered 2018-05-24 – 2018-05-25 (×3): 15 mg via INTRAVENOUS
  Filled 2018-05-24 (×3): qty 1

## 2018-05-24 MED ORDER — METOCLOPRAMIDE HCL 5 MG/ML IJ SOLN
10.0000 mg | Freq: Once | INTRAMUSCULAR | Status: AC
  Administered 2018-05-24: 10 mg via INTRAVENOUS
  Filled 2018-05-24: qty 2

## 2018-05-24 MED ORDER — DIPHENHYDRAMINE HCL 50 MG/ML IJ SOLN
25.0000 mg | Freq: Once | INTRAMUSCULAR | Status: AC
  Administered 2018-05-24: 25 mg via INTRAVENOUS
  Filled 2018-05-24: qty 1

## 2018-05-24 MED ORDER — OXYCODONE HCL 5 MG PO TABS: 5.0000 mg | ORAL_TABLET | ORAL | Status: DC | PRN

## 2018-05-24 MED ORDER — ONDANSETRON HCL 4 MG/2ML IJ SOLN
4.0000 mg | Freq: Once | INTRAMUSCULAR | Status: AC
  Administered 2018-05-24: 4 mg via INTRAVENOUS
  Filled 2018-05-24: qty 2

## 2018-05-24 MED ORDER — ACETAMINOPHEN 500 MG PO TABS
1000.0000 mg | ORAL_TABLET | Freq: Once | ORAL | Status: AC
  Administered 2018-05-24: 1000 mg via ORAL
  Filled 2018-05-24: qty 2

## 2018-05-24 NOTE — ED Notes (Signed)
EDP to bedside. 
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
## 2018-05-24 NOTE — ED Notes (Signed)
Pt moved to room 16  Report called to Nelly Laurence RN
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
## 2018-05-24 NOTE — ED Notes (Signed)
Family at bedside requesting update.  Answered questions as appropriate.  Will have MD speak with family further.

## 2018-05-25 ENCOUNTER — Observation Stay: Payer: Self-pay

## 2018-05-25 DIAGNOSIS — R001 Bradycardia, unspecified: Secondary | ICD-10-CM

## 2018-05-25 LAB — BASIC METABOLIC PANEL
Anion gap: 8 (ref 5–15)
BUN: 9 mg/dL (ref 6–20)
CHLORIDE: 105 mmol/L (ref 98–111)
CO2: 26 mmol/L (ref 22–32)
Calcium: 8.9 mg/dL (ref 8.9–10.3)
Creatinine, Ser: 0.69 mg/dL (ref 0.61–1.24)
GFR calc Af Amer: >60 mL/min (ref >60)
GFR calc non Af Amer: >60 mL/min (ref >60)
Glucose, Bld: 129 mg/dL — ABNORMAL HIGH (ref 70–99)
Potassium: 3.5 mmol/L (ref 3.5–5.1)
Sodium: 139 mmol/L (ref 135–145)

## 2018-05-25 LAB — CBC
HCT: 43.4 % (ref 39.0–52.0)
HEMOGLOBIN: 14.3 g/dL (ref 13.0–17.0)
MCH: 29.9 pg (ref 26.0–34.0)
MCHC: 32.9 g/dL (ref 30.0–36.0)
MCV: 90.6 fL (ref 80.0–100.0)
Platelets: 309 10*3/uL (ref 150–400)
RBC: 4.79 MIL/uL (ref 4.22–5.81)
RDW: 12.1 % (ref 11.5–15.5)
WBC: 8.8 10*3/uL (ref 4.0–10.5)
nRBC: 0 % (ref 0.0–0.2)

## 2018-05-25 LAB — MAGNESIUM: Magnesium: 2 mg/dL (ref 1.7–2.4)

## 2018-05-25 LAB — TROPONIN I: Troponin I: <0.03 ng/mL (ref <0.03)

## 2018-05-25 MED ORDER — OXYCODONE HCL 5 MG PO TABS
5.0000 mg | ORAL_TABLET | ORAL | Status: DC | PRN
  Administered 2018-05-26: 02:00:00 5 mg via ORAL
  Filled 2018-05-25: qty 1

## 2018-05-25 MED ORDER — BUTALBITAL-APAP-CAFFEINE 50-325-40 MG PO TABS
1.0000 | ORAL_TABLET | ORAL | Status: DC | PRN
  Administered 2018-05-25 – 2018-05-26 (×3): 1 via ORAL
  Filled 2018-05-25 (×7): qty 1
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
## 2018-05-25 NOTE — Progress Notes (Signed)
Patient continues to be SB with occasional junctional beats noted this shift, NP notified. See new orders. Continue to monitor patient closely.

## 2018-05-25 NOTE — Plan of Care (Signed)
  Problem: Education: Goal: Knowledge of General Education information will improve Description: Including pain rating scale, medication(s)/side effects and non-pharmacologic comfort measures Outcome: Progressing   Problem: Health Behavior/Discharge Planning: Goal: Ability to manage health-related needs will improve Outcome: Progressing   Problem: Clinical Measurements: Goal: Ability to maintain clinical measurements within normal limits will improve Outcome: Progressing Goal: Will remain free from infection Outcome: Progressing   Problem: Safety: Goal: Ability to remain free from injury will improve Outcome: Progressing   
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
## 2018-05-25 NOTE — Progress Notes (Signed)
His CT scan has remained stable.  I have placed transfer order to MedSurg floor.  He had an episode of transient sinus bradycardia yesterday.  Therefore, I have requested cardiac monitoring.  After transfer, PCCM will sign off. Please call if we can be of further assistance  Billy Fischer, MD PCCM service Mobile [phone] Pager [phone] 05/25/2018 12:24 PM

## 2018-05-26 LAB — HIV ANTIBODY (ROUTINE TESTING W REFLEX): HIV Screen 4th Generation wRfx: NONREACTIVE

## 2018-05-26 MED ORDER — ACETAMINOPHEN 325 MG PO TABS: 650.0000 mg | ORAL_TABLET | Freq: Three times a day (TID) | ORAL | Status: AC | PRN

## 2018-05-26 MED ORDER — BUTALBITAL-APAP-CAFFEINE 50-325-40 MG PO TABS: 1.0000 | ORAL_TABLET | ORAL | 0 refills | Status: DC | PRN

## 2018-05-26 MED ORDER — OXYCODONE HCL 5 MG PO TABS: 5.0000 mg | ORAL_TABLET | Freq: Four times a day (QID) | ORAL | 0 refills | Status: DC | PRN

## 2018-05-26 NOTE — Discharge Instructions (Signed)
Avoid aspirin, advil mortin alleve, bc powder  Concussion, Adult A concussion is a brain injury from a direct hit (blow) to the head or body. This injury causes the brain to shake quickly back and forth inside the skull. It is caused by:  A hit to the head.  A quick and sudden movement (jolt) of the head or neck. How fast you will get better from a concussion depends on many things. Recovery can take time. It is important to wait to return to activity until a doctor says it is safe and your symptoms are all gone. Follow these instructions at home: Activity  Limit activities that need a lot of thought or concentration. You may need to talk with your work Production designer, theatre/television/film or teachers about this. Limit activities such as: ? Homework or work for your job. ? Watching TV. ? Computer work. ? Playing memory games and puzzles.  Rest. Rest helps the brain to heal. Make sure you: ? Get plenty of sleep at night. Do not stay up late. ? Rest during the day. Take naps or rest breaks when you feel tired.  Do not do activities that could cause a second concussion, such as riding a bike or playing sports. It can be dangerous if you get another concussion before the first one has healed.  Ask your doctor when you can return to your normal activities, like driving, riding a bike, or using machinery. Your ability to react may be slower. Do not do these activities if you are dizzy. Your doctor will likely give you a plan for slowly going back to activities. General instructions  Take over-the-counter and prescription medicines only as told by your doctor.  Do not drink alcohol until your doctor says you can.  Watch your symptoms and tell other people to do the same. Other problems (complications) can happen after a concussion. Older adults with a brain injury may have a higher risk of serious problems, such as a blood clot in the brain.  Tell your work Production designer, theatre/television/film, teachers, Tax adviser, school counselor, coach, or  Event organiser about your injury and symptoms. Tell them about what you can or cannot do. They should watch you for: ? More problems with attention or concentration. ? More trouble remembering or learning new information. ? More time needed to do tasks or assignments. ? Being more annoyed (irritable) or having a harder time dealing with stress. ? Any other symptoms that get worse.  Keep all follow-up visits as told by your doctor. This is important. Prevention  It is very important that you donot get another brain injury, especially before you have healed. In rare cases, another injury can cause permanent brain damage, brain swelling, or death. You have the most risk if you get another head injury in the first 7-10 days after you were hurt before. To avoid injuries: ? Avoid activities that could make you get a second concussion, like contact sports. ? When you have returned to sports or activities:  Avoid plays or moves that can cause you to crash into another person. This is how most concussions happen.  Follow the rules and be respectful of other players. ? Get regular exercise that includes strength and balance training. ? Wear a helmet when you do activities like:  Biking.  Skiing.  Skateboarding.  Skating. ? Helmets can help protect you from serious skull and brain injuries, but they do not protect your from a concussion. Even when wearing a helmet, you should avoid being hit  in the head. Contact a doctor if:  Your symptoms get worse or they do not get better.  You have new symptoms.  You have another injury. Get help right away if:  You have bad headaches or your headaches get worse.  You have weakness in any part of your body.  You are confused.  Your coordination gets worse.  You keep throwing up (vomiting).  You feel more sleepy than normal.  You twitch or shake violently (convulse) or have a seizure.  Your speech is not clear (is slurred).  You have  strange behavior changes.  You have changes in how you see (vision).  You pass out (lose consciousness). Summary  A concussion is a brain injury from a direct hit (blow) to the head or body.  This condition is treated with rest and careful watching of symptoms.  If you keep having symptoms, call your doctor. This information is not intended to replace advice given to you by your health care provider. Make sure you discuss any questions you have with your health care provider. Document Released: 03/11/2009 Document Revised: 05/04/2017 Document Reviewed: 05/04/2017 Elsevier Interactive Patient Education  2019 Elsevier Inc.   Head Injury, Adult There are many types of head injuries. They can be as minor as a bump. Some head injuries can be worse. Worse injuries include:  A strong hit to the head that hurts the brain (concussion).  A bruise of the brain (contusion). This means there is bleeding in the brain that can cause swelling.  A cracked skull (skull fracture).  Bleeding in the brain that gathers, gets thick (makes a clot), and forms a bump (hematoma). Most problems from a head injury come in the first 24 hours. However, you may still have side effects up to 7-10 days after your injury. It is important to watch your condition for any changes. Follow these instructions at home: Activity  Rest as much as possible.  Avoid activities that are hard or tiring.  Make sure you get enough sleep.  Limit activities that need a lot of thought or attention, such as: ? Watching TV. ? Playing memory games and puzzles. ? Job-related work or homework. ? Working on [employer], and texting.  Avoid activities that could cause another head injury until your doctor says it is okay. This includes playing sports.  Ask your doctor when it is safe for you to go back to your normal activities, such as work or school. Ask your doctor for a step-by-step plan for slowly going back to  your normal activities.  Ask your doctor when you can drive, ride a bicycle, or use heavy machinery. Never do these activities if you are dizzy. Lifestyle  Do not drink alcohol until your doctor says it is okay.  Avoid drug use.  If it is harder than usual to remember things, write them down.  If you are easily distracted, try to do one thing at a time.  Talk with family members or close friends when making important decisions.  Tell your friends, family, a trusted coworker, and work Production designer, theatre/television/film about your injury, symptoms, and limits (restrictions). Have them watch for any problems that are new or getting worse. General instructions  Take over-the-counter and prescription medicines only as told by your doctor.  Have someone stay with you for 24 hours after your head injury. This person should watch you for any changes in your symptoms and be ready to get help.  Keep all follow-up visits as  told by your doctor. This is important. How is this prevented?  Work on Therapist, music. This can help you avoid falls.  Wear a seatbelt when you are in a moving vehicle.  Wear a helmet when: ? Riding a bicycle. ? Skiing. ? Doing any other sport or activity that has a risk of injury.  Drink alcohol only in moderation.  Make your home safer by: ? Getting rid of clutter from the floors and stairs, like things that can make you trip. ? Using grab bars in bathrooms and handrails by stairs. ? Placing non-slip mats on floors and in bathtubs. ? Putting more light in dim areas. Get help right away if:  You have: ? A very bad (severe) headache that is not helped by medicine. ? Trouble walking or weakness in your arms and legs. ? Clear or bloody fluid coming from your nose or ears. ? Changes in your seeing (vision). ? Jerky movements that you cannot control (seizure).  You throw up (vomit).  Your symptoms get worse.  You lose balance.  Your speech is slurred.  You pass  out.  You are sleepier and have trouble staying awake.  The black centers of your eyes (pupils) change in size. These symptoms may be an emergency. Do not wait to see if the symptoms will go away. Get medical help right away. Call your local emergency services (911 in the [place] Do not drive yourself to the hospital. This information is not intended to replace advice given to you by your health care provider. Make sure you discuss any questions you have with your health care provider. Document Released: 03/05/2008 Document Revised: 12/15/2017 Document Reviewed: 10/01/2015 Elsevier Interactive Patient Education  2019 Elsevier Inc.   Intracerebral Hemorrhage  An intracerebral hemorrhage occurs when a blood vessel in the brain leaks or bursts. As a result, that area of the brain becomes damaged from a lack of blood and oxygen and from a pooling of leaked blood that presses on brain tissue. This is also called a bleeding (hemorrhagic) stroke. A stroke is a medical emergency. It must be treated right away. Early and prompt treatment will increase the chances of better recovery. Delay may lead to permanent damage and loss of brain function. What are the causes? This condition is often caused by very high blood pressure. It may also be caused by:  Head injury (trauma).  A bulging of a weak section in a blood vessel (aneurysm).  Thin and hardened blood vessels. This occurs when plaque builds on the walls of an artery.  An abnormal formation of a blood vessel (arteriovenous malformation). This condition results in an abnormal tangle of thin-walled blood vessels.  A tumor in the brain.  Protein buildup on the artery walls of the brain (amyloid angiopathy). Sometimes the cause of this condition is not known. What increases the risk? You are more likely to develop this condition if:  You have high blood pressure (hypertension).  You are an older adult.  You abuse alcohol or drugs.  You  take blood-thinning medicines (anticoagulants). What are the signs or symptoms? Symptoms of this condition include:  Sudden onset of these problems: ? Weakness or numbness of the face, arm, or leg, especially on one side of the body. ? Nausea and vomiting. ? Trouble speaking (aphasia) or understanding speech. ? Trouble seeing in one or both eyes. ? Difficulty walking or difficulty moving arms or legs. ? Loss of balance or coordination. ? Confusion. ? Dizziness.  Seizures.  Hypertension. How is this diagnosed? This condition may be diagnosed based on your medical history and physical exam. You may also have other tests, including:  CT scan or MRI to view the brain.  Computed tomography angiography (CTA) or a magnetic resonance angiogram (MRA) to view vessels in the brain. How is this treated? The goals of treating this condition are to stop the bleeding, control pressure in the brain, and relieve symptoms. Treatment may include:  Medicines to treat symptoms, such as hypertension, pain, and nausea and vomiting.  Other medicines, blood products, or vitamin K to control the bleeding.  A tube (shunt) in the brain to relieve pressure.  Assisted breathing (ventilation).  Surgery to stop bleeding, remove a blood clot or tumor, and reduce pressure. Surgeries may include: ? Craniotomy, to remove a part of the skull in order to reduce pressure in the brain. ? Stereotactic aspiration, to remove blood from the brain using a needle or syringe. Follow these instructions at home: Medicines  Take over-the-counter and prescription medicines only as told by your health care provider.  Do not take medicines such as aspirin and ibuprofen unless your health care provider tells you to take them. These medicines can thin your blood and increase the risk of bleeding. Eating and drinking  Eat healthy foods as directed by your health care provider. You may be asked to: ? Eat a diet that is low in  salt (sodium), saturated fat, trans fat, and cholesterol to manage your blood pressure.  Seek the help of specialists if your ability to swallow safely has been affected. A dietitian, speech and language specialists, and an occupational therapist can teach you how to get the nutrition you need.  Limit alcohol intake to no more than 1 drink a day for nonpregnant women and 2 drinks a day for men. One drink equals 12 oz of beer, 5 oz of wine, or 1 oz of hard liquor. Lifestyle  Do not use any products that contain nicotine or tobacco, such as cigarettes and e-cigarettes. If you need help quitting, ask your health care provider.  Follow your health care provider's instructions about preventing falls. Your health care provider may: ? Arrange for specialists to evaluate your home. ? Recommend that you install grab bars in the bedroom and bathroom. ? Arrange for special equipment to be used at home, such as a raised toilet and a seat for the shower.  Use a walker or a cane at all times if directed by your health care provider.  Do physical activities as told by your health care provider. Ask your health care provider what activities are safe for you. General instructions  Manage any other health conditions you may have, including high blood pressure, diabetes, and excess body weight.  Keep all follow-up visits as told by your health care provider. This is important. Visits include referrals for physical and occupational therapy, rehabilitation, and lab tests. Get help right away if:   You have any symptoms of a stroke. "BE FAST" is an easy way to remember the main warning signs of a stroke: ? B - Balance. Signs are dizziness, sudden trouble walking, or loss of balance. ? E - Eyes. Signs are trouble seeing or a sudden change in vision. ? F - Face. Signs are sudden weakness or numbness of the face, or the face or eyelid drooping on one side. ? A - Arms. Signs are weakness or numbness in an arm.  This happens suddenly and usually on  one side of the body. ? S - Speech. Signs are sudden trouble speaking, slurred speech, or trouble understanding what people say. ? T - Time. Time to call emergency services. Write down what time symptoms started.  You have other signs of a stroke, such as: ? A sudden, severe headache with no known cause. ? Nausea or vomiting. ? Seizure. ? You have a partial or total loss of consciousness. ? Confusion. These symptoms may represent a serious problem that is an emergency. Do not wait to see if the symptoms will go away. Get medical help right away. Call your local emergency services (911 in the [place] Do not drive yourself to the hospital. Summary  An intracerebral hemorrhage occurs when a blood vessel in the brain leaks or bursts.  This condition is often caused by very high blood pressure.  Early and prompt treatment will increase the chances of better recovery. Delay may lead to permanent damage and loss of brain function.  The goals of treatment are to stop the bleeding, control pressure in the brain, and relieve symptoms. This information is not intended to replace advice given to you by your health care provider. Make sure you discuss any questions you have with your health care provider. Document Released: 10/18/2013 Document Revised: 02/24/2017 Document Reviewed: 02/24/2017 Elsevier Interactive Patient Education  2019 [employer].
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
## 2018-05-26 NOTE — Care Management Note (Signed)
Case Management Note  Patient Details  Name: Jerry Herrera MRN: [id number] Date of Birth: 06/29/1980  Subjective/Objective:    Admitted to [facility] under observation status with the diagnosis of intracranial bleed. Lines with friend Ms. Brooke Dare [phone]), Mother is Verlon Au [phone]). No primary care physician. Last seen Dr. Yetta Barre when he was 38 years old.  Works in [employer].                Action/Plan: Application to [facility] and Medication Management given Referral to Smiley Houseman at [facility].    Expected Discharge Date:  05/26/18               Expected Discharge Plan:     In-House Referral:   yes  Discharge planning Services   yes  Post Acute Care Choice:    Choice offered to:     DME Arranged:    DME Agency:     HH Arranged:    HH Agency:     Status of Service:     If discussed at [employer], dates discussed:    Additional Comments:  Gwenette Greet, RN MSN CCM Care Management [phone] 05/26/2018, 8:57 AM

## 2018-05-26 NOTE — Progress Notes (Signed)
Discharge instructions given and went over with patient at bedside. All questions answered. Patient to discharge home. Currently stating that he is missing money. Security called, awaiting call back. Bo Mcclintock, RN

## 2018-05-26 NOTE — Progress Notes (Signed)
Patient discharged home. Achillies Buehl S, RN  

## 2018-05-26 NOTE — Progress Notes (Signed)
Patient ID: Jerry Herrera   Work note Patient was admitted to [facility] on 05/24/2018.  Discharged 05/26/2018.  Please remain out of work through 06/02/2018.  Would recommend outpatient follow-up for note to return to work.  Dr. Alford Highland [phone]

## 2018-05-26 NOTE — Progress Notes (Signed)
No findings from security. Bo Mcclintock, RN

## 2018-07-06 ENCOUNTER — Telehealth: Payer: Self-pay

## 2018-07-06 NOTE — Telephone Encounter (Signed)
2 phone calls made 1 06/02/18 and the 2nd 06/15/18 both rec'd the same message call c

## 2018-08-13 ENCOUNTER — Other Ambulatory Visit: Payer: Self-pay

## 2018-08-13 ENCOUNTER — Emergency Department
Admission: EM | Admit: 2018-08-13 | Discharge: 2018-08-13 | Disposition: A | Discharge status: Home / Self Care | Payer: Self-pay | Attending: Emergency Medicine | Admitting: Emergency Medicine

## 2018-08-13 DIAGNOSIS — L0201 Cutaneous abscess of face: Secondary | ICD-10-CM | POA: Insufficient documentation

## 2018-08-13 DIAGNOSIS — L0291 Cutaneous abscess, unspecified: Secondary | ICD-10-CM

## 2018-08-13 DIAGNOSIS — F419 Anxiety disorder, unspecified: Secondary | ICD-10-CM | POA: Insufficient documentation

## 2018-08-13 DIAGNOSIS — Z87891 Personal history of nicotine dependence: Secondary | ICD-10-CM | POA: Insufficient documentation

## 2018-08-13 DIAGNOSIS — F319 Bipolar disorder, unspecified: Secondary | ICD-10-CM | POA: Insufficient documentation

## 2018-08-13 MED ORDER — SULFAMETHOXAZOLE-TRIMETHOPRIM 800-160 MG PO TABS: 1.0000 | ORAL_TABLET | Freq: Two times a day (BID) | ORAL | 0 refills | Status: DC

## 2018-08-13 MED ORDER — SULFAMETHOXAZOLE-TRIMETHOPRIM 800-160 MG PO TABS
1.0000 | ORAL_TABLET | Freq: Once | ORAL | Status: AC
  Administered 2018-08-13: 1 via ORAL
  Filled 2018-08-13: qty 1

## 2018-08-13 MED ORDER — LIDOCAINE HCL (PF) 1 % IJ SOLN
INTRAMUSCULAR | Status: AC
  Filled 2018-08-13: qty 5

## 2018-08-13 NOTE — ED Provider Notes (Addendum)
[facility] Emergency Department Provider Note  Time seen: 3:31 AM  I have reviewed the triage vital signs and the nursing notes.   HISTORY  Chief Complaint Abscess   HPI Jerry Herrera is a 38 y.o. male with a past medical history of anxiety, bipolar, substance abuse, presents to the emergency department for an abscess to his left nostril.  According to the patient for the past 3 to 4 days he has developed a progressively enlarging abscess to the left nostril.  Patient states it was draining on the inside, but is no longer doing so.  Patient states it has become extremely painful.  Denies any fever.  No recent cough congestion or shortness of breath.   Past Medical History:  Diagnosis Date  . Anxiety   . Bipolar 1 disorder (HCC)   . Fatty liver   . Polysubstance abuse (HCC)   . PTSD (post-traumatic stress disorder)     Patient Active Problem List   Diagnosis Date Noted  . Intracranial bleed (HCC) 05/24/2018  . Sepsis (HCC) 12/29/2016  . Target rash 12/29/2016  . PTSD (post-traumatic stress disorder) 12/29/2016    Past Surgical History:  Procedure Laterality Date  . FRACTURE SURGERY    . NO PAST SURGERIES      Prior to Admission medications   Medication Sig Start Date End Date Taking? Authorizing Provider  acetaminophen (TYLENOL) 325 MG tablet Take 2 tablets (650 mg total) by mouth 3 (three) times daily as needed. 05/26/18   Alford HighlandWieting, Richard, MD  butalbital-acetaminophen-caffeine (FIORICET, ESGIC) [phone] MG tablet Take 1 tablet by mouth every 4 (four) hours as needed for headache. 05/26/18   Alford HighlandWieting, Richard, MD  oxyCODONE (OXY IR/ROXICODONE) 5 MG immediate release tablet Take 1 tablet (5 mg total) by mouth every 6 (six) hours as needed for moderate pain or severe pain. 05/26/18   Alford HighlandWieting, Richard, MD    No Known Allergies  Family History  Problem Relation Age of Onset  . Diabetes Other     Social History Social History   Tobacco  Use  . Smoking status: Former Games developermoker  . Smokeless tobacco: Never Used  Substance Use Topics  . Alcohol use: Yes    Alcohol/week: 12.0 standard drinks    Types: 12 Cans of beer per week    Comment: 3 times at week  . Drug use: Yes    Types: Marijuana    Review of Systems Constitutional: Negative for fever. ENT: Painful swollen area/abscess to left nostril Cardiovascular: Negative for chest pain. Respiratory: Negative for shortness of breath. Gastrointestinal: Negative for abdominal pain Skin: Abscess to left nostril Neurological: Negative for headache All other ROS negative  ____________________________________________   PHYSICAL EXAM:  VITAL SIGNS: ED Triage Vitals  Enc Vitals Group     BP 08/13/18 0246 132/76     Pulse Rate 08/13/18 0246 69     Resp 08/13/18 0246 16     Temp 08/13/18 0246 98.1 F (36.7 C)     Temp Source 08/13/18 0246 Oral     SpO2 08/13/18 0246 100 %     Weight 08/13/18 0235 185 lb (83.9 kg)     Height 08/13/18 0235 5\' 11"  (1.803 m)     Head Circumference --      Peak Flow --      Pain Score 08/13/18 0235 8     Pain Loc --      Pain Edu? --      Excl. in GC? --  Constitutional: Alert and oriented. Well appearing and in no distress. Eyes: Normal exam ENT      Head: Normocephalic and atraumatic.      Nose: Patient has swelling and significant tenderness to the left nostril, pointing on the anterior of the nostril consistent with abscess.      Mouth/Throat: Mucous membranes are moist. Cardiovascular: Normal rate, regular rhythm.  Respiratory: Normal respiratory effort without tachypnea nor retractions. Breath sounds are clear  Gastrointestinal: Soft and nontender. No distention. Musculoskeletal: Nontender with normal range of motion in all extremities. Neurologic:  Normal speech and language. No gross focal neurologic deficits  Skin: Erythema tenderness and swelling to the left nostril.  No surrounding cellulitis noted. Psychiatric: Mood and  affect are normal.   ____________________________________________   INITIAL IMPRESSION / ASSESSMENT AND PLAN / ED COURSE  Pertinent labs & imaging results that were available during my care of the patient were reviewed by me and considered in my medical decision making (see chart for details).   Presents to the emergency department with swelling tenderness and erythema to the left nostril.  Bedside ultrasound shows a small fluid collection.  I used topical lidocaine inside the nostril, and used an 18-gauge needle to unroofed the abscess from the inside.  Small amount of drainage from the abscess.  We will place the patient on Bactrim for the next 10 days twice daily.  I discussed return precautions for any spread of redness/tenderness or for any fever.  Patient agreeable to plan of care.  INCISION AND DRAINAGE Performed by: Minna Antis Consent: Verbal consent obtained. Risks and benefits: risks, benefits and alternatives were discussed Type: abscess  Body area: left nare  Anesthesia: local infiltration  Incision was made with 18ga needle  Local anesthetic: lidocaine 2% topical  Anesthetic total: 3 ml  Complexity: simple  Drainage: purulent  Drainage amount: 1cc  Patient tolerance: Patient tolerated the procedure well with no immediate complications.     ROMAIN KAISER was evaluated in Emergency Department on 08/13/2018 for the symptoms described in the history of present illness. He was evaluated in the context of the global COVID-19 pandemic, which necessitated consideration that the patient might be at risk for infection with the SARS-CoV-2 virus that causes COVID-19. Institutional protocols and algorithms that pertain to the evaluation of patients at risk for COVID-19 are in a state of rapid change based on information released by regulatory bodies including the CDC and federal and state organizations. These policies and algorithms were followed during the  patient's care in the ED.  ____________________________________________   FINAL CLINICAL IMPRESSION(S) / ED DIAGNOSES  Abscess   Minna Antis, MD 08/13/18 0375    Minna Antis, MD 08/13/18 [phone]

## 2018-08-13 NOTE — ED Triage Notes (Signed)
?  abscess to nose for 3 days.

## 2018-08-13 NOTE — ED Notes (Signed)
Reviewed discharge instructions, follow-up care, and prescriptions with patient. Patient verbalized understanding of all information reviewed. Patient stable, with no distress noted at this time.    

## 2018-08-13 NOTE — ED Notes (Signed)
ED Provider at bedside. 
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
## 2023-07-15 ENCOUNTER — Emergency Department

## 2023-07-15 ENCOUNTER — Emergency Department
Admission: EM | Admit: 2023-07-15 | Discharge: 2023-07-15 | Disposition: A | Discharge status: Home / Self Care | Attending: Emergency Medicine | Admitting: Emergency Medicine

## 2023-07-15 ENCOUNTER — Other Ambulatory Visit: Payer: Self-pay

## 2023-07-15 DIAGNOSIS — R350 Frequency of micturition: Secondary | ICD-10-CM | POA: Diagnosis not present

## 2023-07-15 DIAGNOSIS — R3916 Straining to void: Secondary | ICD-10-CM

## 2023-07-15 DIAGNOSIS — R1031 Right lower quadrant pain: Secondary | ICD-10-CM | POA: Insufficient documentation

## 2023-07-15 DIAGNOSIS — R109 Unspecified abdominal pain: Secondary | ICD-10-CM

## 2023-07-15 DIAGNOSIS — R300 Dysuria: Secondary | ICD-10-CM | POA: Diagnosis not present

## 2023-07-15 LAB — COMPREHENSIVE METABOLIC PANEL WITH GFR
ALT: 15 U/L (ref 0–44)
AST: 15 U/L (ref 15–41)
Albumin: 4 g/dL (ref 3.5–5.0)
Alkaline Phosphatase: 81 U/L (ref 38–126)
Anion gap: 10 (ref 5–15)
BUN: 15 mg/dL (ref 6–20)
CO2: 26 mmol/L (ref 22–32)
Calcium: 9.2 mg/dL (ref 8.9–10.3)
Chloride: 102 mmol/L (ref 98–111)
Creatinine, Ser: 0.96 mg/dL (ref 0.61–1.24)
GFR, Estimated: >60 mL/min (ref >60)
Glucose, Bld: 87 mg/dL (ref 70–99)
Potassium: 3.9 mmol/L (ref 3.5–5.1)
Sodium: 138 mmol/L (ref 135–145)
Total Bilirubin: 1.1 mg/dL (ref 0.0–1.2)
Total Protein: 7 g/dL (ref 6.5–8.1)

## 2023-07-15 LAB — CHLAMYDIA/NGC RT PCR (ARMC ONLY)
Chlamydia Tr: NOT DETECTED
N gonorrhoeae: NOT DETECTED

## 2023-07-15 LAB — WET PREP, GENITAL
Clue Cells Wet Prep HPF POC: NONE SEEN
Sperm: NONE SEEN
Trich, Wet Prep: NONE SEEN
WBC, Wet Prep HPF POC: <10 (ref <10)
Yeast Wet Prep HPF POC: NONE SEEN

## 2023-07-15 LAB — URINALYSIS, ROUTINE W REFLEX MICROSCOPIC
Bilirubin Urine: NEGATIVE
Glucose, UA: NEGATIVE mg/dL
Hgb urine dipstick: NEGATIVE
Ketones, ur: NEGATIVE mg/dL
Leukocytes,Ua: NEGATIVE
Nitrite: NEGATIVE
Protein, ur: NEGATIVE mg/dL
Specific Gravity, Urine: 1.017 (ref 1.005–1.030)
pH: 7 (ref 5.0–8.0)

## 2023-07-15 LAB — LIPASE, BLOOD: Lipase: 30 U/L (ref 11–51)

## 2023-07-15 LAB — CBC
HCT: 43.4 % (ref 39.0–52.0)
Hemoglobin: 14.8 g/dL (ref 13.0–17.0)
MCH: 31.4 pg (ref 26.0–34.0)
MCHC: 34.1 g/dL (ref 30.0–36.0)
MCV: 92.1 fL (ref 80.0–100.0)
Platelets: 296 10*3/uL (ref 150–400)
RBC: 4.71 MIL/uL (ref 4.22–5.81)
RDW: 12.4 % (ref 11.5–15.5)
WBC: 5.8 10*3/uL (ref 4.0–10.5)
nRBC: 0 % (ref 0.0–0.2)

## 2023-07-15 LAB — TROPONIN I (HIGH SENSITIVITY): Troponin I (High Sensitivity): <2 ng/L (ref <18)

## 2023-07-15 MED ORDER — KETOROLAC TROMETHAMINE 15 MG/ML IJ SOLN
15.0000 mg | Freq: Once | INTRAMUSCULAR | Status: AC
  Administered 2023-07-15: 15 mg via INTRAVENOUS
  Filled 2023-07-15: qty 1

## 2023-07-15 MED ORDER — IOHEXOL 300 MG/ML  SOLN
100.0000 mL | Freq: Once | INTRAMUSCULAR | Status: AC | PRN
  Administered 2023-07-15: 100 mL via INTRAVENOUS

## 2023-07-15 NOTE — Discharge Instructions (Signed)
 You can take 600 mg of Tylenol or 400 mg of ibuprofen every 6 hours as needed for pain.  If pain is persistent or worsening, please come back here to the emergency department to be seen.  Otherwise follow-up with your primary care doctor in 2 to 3 days to get reassessed.  I have also provided urology's follow-up so that you can call and set up a follow-up appointment.

## 2023-07-15 NOTE — ED Notes (Signed)
 See triage note  Presents with right flank pain   States he thinks he has had a UTI for several weeks  States pain is increased today  Afebrile on arrival

## 2023-07-15 NOTE — ED Provider Notes (Signed)
 [facility] Provider Note    Event Date/Time   First MD Initiated Contact with Patient 07/15/23 1325     (approximate)   History   Flank Pain   HPI  Jerry Herrera is a 43 y.o. male who comes in with right-sided flank pain for the past few weeks patient does report some pain with urination and increased urinary frequency.  Patient reports that he has had some pressure with trying to urinate for some time now but denies really a burning sensation.  Denies any testicle pain.  He is reports being on some antibiotics for UTI few months ago as his wife also had a UTI.  He states that after the antibiotics he did not really have much improvement with the urinary symptoms.  He reported that last night he had severe right sided flank pain radiating into his abdomen.  He denies any testicle pain.  He reports only being sexually active with his partner.  No chest pain no SOB.     Physical Exam   Triage Vital Signs: ED Triage Vitals  Encounter Vitals Group     BP 07/15/23 1306 (!) 156/112     Systolic BP Percentile --      Diastolic BP Percentile --      Pulse Rate 07/15/23 1306 63     Resp 07/15/23 1306 20     Temp 07/15/23 1306 98 F (36.7 C)     Temp Source 07/15/23 1306 Oral     SpO2 07/15/23 1306 98 %     Weight 07/15/23 1326 184 lb 15.5 oz (83.9 kg)     Height 07/15/23 1326 5\' 11"  (1.803 m)     Head Circumference --      Peak Flow --      Pain Score 07/15/23 1307 6     Pain Loc --      Pain Education --      Exclude from Growth Chart --     Most recent vital signs: Vitals:   07/15/23 1306  BP: (!) 156/112  Pulse: 63  Resp: 20  Temp: 98 F (36.7 C)  SpO2: 98%     General: Awake, no distress.  CV:  Good peripheral perfusion.  Resp:  Normal effort.  Abd:  No distention.  Tender in the right lower abdomen. Other:  Pt declined testicle exam- denies any pain here.   ED Results / Procedures / Treatments   Labs (all labs ordered  are listed, but only abnormal results are displayed) Labs Reviewed  URINALYSIS, ROUTINE W REFLEX MICROSCOPIC - Abnormal; Notable for the following components:      Result Value   Color, Urine YELLOW (*)    APPearance HAZY (*)    All other components within normal limits  CBC  LIPASE, BLOOD  COMPREHENSIVE METABOLIC PANEL WITH GFR     EKG  My interpretation of EKG:  Sinus bradycardia with a rate of 58 without any ST elevation or T wave inversions  RADIOLOGY Pending    PROCEDURES:  Critical Care performed: No  Procedures   MEDICATIONS ORDERED IN ED: Medications  iohexol (OMNIPAQUE) 300 MG/ML solution 100 mL (has no administration in time range)  ketorolac (TORADOL) 15 MG/ML injection 15 mg (15 mg Intravenous Given 07/15/23 1428)     IMPRESSION / MDM / ASSESSMENT AND PLAN / ED COURSE  I reviewed the triage vital signs and the nursing notes.   Patient's presentation is most consistent with acute presentation with  potential threat to life or bodily function.   Patient comes in with right-sided abdominal pain will get CT to rule out appendicitis, diverticulitis, kidney stones.  I am not sure what to make of his urinary symptoms he could have enlarged prostate.  His urine is without evidence of UTI.  Seems like he is low risk for STDs that he does not want to do testing although he does report being monogamous with his wife.  Patient was given some Toradol to help with symptom treatment.  His urine is without evidence of UTI.  CBC is normal.  Wet prep negative lipase normal CMP normal  Patient be handed off to oncoming team pending CT imaging    FINAL CLINICAL IMPRESSION(S) / ED DIAGNOSES   Final diagnoses:  Right sided abdominal pain     Rx / DC Orders   ED Discharge Orders     None        Note:  This document was prepared using Dragon voice recognition software and may include unintentional dictation errors.   Concha Se, MD 07/15/23 [phone]

## 2023-07-15 NOTE — ED Provider Notes (Signed)
.-----------------------------------------   3:21 PM on 07/15/2023 -----------------------------------------  Blood pressure (!) 156/112, pulse 63, temperature 98 F (36.7 C), temperature source Oral, resp. rate 20, height 5\' 11"  (1.803 m), weight 83.9 kg, SpO2 98%.  Assuming care from Dr. Fuller Plan.  In short, Jerry Herrera is a 43 y.o. male with a chief complaint of Flank Pain .  Refer to the original H&P for additional details.  The current plan of care is to follow-up CT imaging, reassessment.   Independent review of labs imaging are below, prostates not enlarged on CT, no acute pathology seen on CT.  On reassessment patient states is not having any dysuria or hematuria, just having some straining when he goes to the bathroom.  Given that the UA is negative and that he does not have any dysuria, I believe the antibiotics are not indicated at this time.  Shared decision making done with patient and he is agreeable with the plan for discharge with outpatient urology follow-up.  Considered but no indication for inpatient admission, he is safe for outpatient management.  Discharged with strict precautions.  Clinical Course as of 07/15/23 1642  Thu Jul 15, 2023  1605 Independent review of labs, GC and gonorrhea are negative.  Negative for yeast, trichomonas, troponin is not elevated, lipase is normal, electrolytes severely deranged, UA is negative, LFTs are normal. [TT]  1632 CT ABDOMEN PELVIS W CONTRAST No acute intra-abdominal or pelvic pathology. Prostate not enlarged. [TT]    Clinical Course User Index [TT] Claybon Jabs, MD      Claybon Jabs, MD 07/15/23 [phone]

## 2023-07-15 NOTE — ED Triage Notes (Signed)
 Pt to ED via POV from home. Pt reports intermittent right side flank pain for a few weeks. Pt reports concerned for UTI or kidney infection. Pt reports increased urinary frequency and burning with urination.   Pt reports drink etoh daily - pt reports 2-3 glasses a day.
